# Patient Record
Sex: Female | Born: 1935 | Race: White | Hispanic: No | Smoking: Never smoker
Health system: Southern US, Community
[De-identification: ages and names within clinical notes are randomized; demographics above are authoritative.]

## PROBLEM LIST (undated history)

## (undated) DIAGNOSIS — R509 Fever, unspecified: Secondary | ICD-10-CM

## (undated) DIAGNOSIS — I639 Cerebral infarction, unspecified: Secondary | ICD-10-CM

## (undated) DIAGNOSIS — R5383 Other fatigue: Secondary | ICD-10-CM

## (undated) DIAGNOSIS — I10 Essential (primary) hypertension: Secondary | ICD-10-CM

## (undated) DIAGNOSIS — R61 Generalized hyperhidrosis: Secondary | ICD-10-CM

## (undated) DIAGNOSIS — R490 Dysphonia: Secondary | ICD-10-CM

## (undated) DIAGNOSIS — Z780 Asymptomatic menopausal state: Secondary | ICD-10-CM

## (undated) DIAGNOSIS — M316 Other giant cell arteritis: Secondary | ICD-10-CM

## (undated) HISTORY — DX: Fever, unspecified: R50.9

## (undated) HISTORY — DX: Dysphonia: R49.0

## (undated) HISTORY — PX: TEMPORAL ARTERY BIOPSY / LIGATION: SUR132

## (undated) HISTORY — DX: Cerebral infarction, unspecified: I63.9

## (undated) HISTORY — DX: Generalized hyperhidrosis: R61

## (undated) HISTORY — DX: Other fatigue: R53.83

## (undated) HISTORY — PX: OTHER SURGICAL HISTORY: SHX169

## (undated) HISTORY — DX: Asymptomatic menopausal state: Z78.0

---

## 2001-01-03 HISTORY — PX: JOINT REPLACEMENT: SHX530

## 2006-05-12 ENCOUNTER — Encounter: Admission: RE | Admit: 2006-05-12 | Discharge: 2006-05-12 | Payer: Self-pay | Admitting: Family Medicine

## 2006-05-15 ENCOUNTER — Encounter: Admission: RE | Admit: 2006-05-15 | Discharge: 2006-05-15 | Payer: Self-pay | Admitting: Family Medicine

## 2010-07-21 ENCOUNTER — Ambulatory Visit (HOSPITAL_BASED_OUTPATIENT_CLINIC_OR_DEPARTMENT_OTHER)
Admission: RE | Admit: 2010-07-21 | Discharge: 2010-07-21 | Disposition: A | Discharge status: Home / Self Care | Payer: Medicare Other | Source: Ambulatory Visit | Attending: General Surgery | Admitting: General Surgery

## 2010-07-21 ENCOUNTER — Other Ambulatory Visit (INDEPENDENT_AMBULATORY_CARE_PROVIDER_SITE_OTHER): Payer: Self-pay | Admitting: General Surgery

## 2010-07-21 DIAGNOSIS — M316 Other giant cell arteritis: Secondary | ICD-10-CM

## 2010-07-21 DIAGNOSIS — R700 Elevated erythrocyte sedimentation rate: Secondary | ICD-10-CM | POA: Insufficient documentation

## 2010-07-22 NOTE — Op Note (Signed)
NAMECAMBREIGH, DEARING               ACCOUNT NO.:  [id number]  MEDICAL RECORD NO.:  [id number]  LOCATION:                                 FACILITY:  PHYSICIAN:  Anselm Pancoast. Alejandra Barna, M.D.DATE OF BIRTH:  07/21/2008  DATE OF PROCEDURE: DATE OF DISCHARGE:                              OPERATIVE REPORT   PREOPERATIVE DIAGNOSIS:  Rule out temporal artery biopsy.  PROCEDURE:  Right temporal artery biopsy.  ANESTHESIA:  Local anesthesia in the minor surgery room.  HISTORY:  Madison Bennett is a 76 year old female who I first saw approximately a month ago when she had been referred by her primary care physician for right temporal artery biopsy.  The patient had kind of not the usual history of right-sided headache, etc., but had lost some weight, had actually fallen several months previously.  She says she just tripped and then was complaining of some kind of temporomandibular joint type tissues and her workup had a slightly elevated sed rate and the question of temporal artery disease was raised.  They had started on prednisone and she was asked to see me.  Her daughter who is a physical therapist accompanying her was questioning the necessity of a temporal artery biopsy with no headache and etc., and I had her see Dr. Lesia Sago who is managing her husband's problem of a neuropathy and aging symptoms, and Dr. Anne Hahn recommended that one go ahead and do the biopsy, but be off the prednisone for approximately 2 weeks prior to the biopsy being performed.  At present, she is having no symptoms, no headache.  Her joint soreness etc., has resolved.  She has seen Dr. Valentina Lucks and Dr. Valentina Lucks appears to think that she needs prednisone regardless, but she is here for the planned procedure.  The patient was positioned on the OR table, permit had been signed.  I prepped the area widely with Betadine solution and then had used a Doppler to kind of mark the skin where the artery is most easily  felt, there was a very good pulse.  Then she was prepped and draped and with a few mL of 1% plain Xylocaine, I anesthetized the area over the vessel, dissected down the artery, she had a good bounding pulse, and I dissected it out so that there is a good inch and a half segment of the artery including one of the bifurcations.  All of the little ends were tied with 4-0 Vicryl.  I doubly tied the proximal area and then sent this segment, which will be kind of a rush results, so we will have the results tomorrow.  Whether she gets back on steroids or not will be left up to her regular physicians, whether it is Dr. Anne Hahn or Dr. Valentina Lucks.  She is in agreement with this and I will see her in the office for removal of the sutures next week and we will call her with the results of the path report tomorrow afternoon after it was completed.     Anselm Pancoast. Zachery Dakins, M.D.     WJW/MEDQ  D:  07/21/2010  T:  07/21/2010  Job:  [id number]  cc:   Gretta Arab. Valentina Lucks, M.D. C.  Lesia Sago, M.D.  Electronically Signed by Consuello Bossier M.D. on 07/22/2010 08:31:30 AM

## 2010-07-30 ENCOUNTER — Ambulatory Visit (INDEPENDENT_AMBULATORY_CARE_PROVIDER_SITE_OTHER): Payer: Medicare Other | Admitting: General Surgery

## 2010-07-30 ENCOUNTER — Encounter (INDEPENDENT_AMBULATORY_CARE_PROVIDER_SITE_OTHER): Payer: Self-pay | Admitting: General Surgery

## 2010-07-30 VITALS — BP 150/90 | HR 104 | Temp 97.7°F | Wt 131.4 lb

## 2010-07-30 DIAGNOSIS — M316 Other giant cell arteritis: Secondary | ICD-10-CM

## 2010-07-30 NOTE — Progress Notes (Signed)
Subjective:     Patient ID: Madison Bennett, female   DOB: 15-May-1934, 76 y.o.   MRN: [id number]  HPI Vague symptoms no headache Patient had previously been started on low-dose steroids by her medical physician he saw Dr. 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 basis and has not had much side effects from the steroids

## 2011-08-24 ENCOUNTER — Other Ambulatory Visit: Payer: Self-pay | Admitting: Family Medicine

## 2011-08-24 ENCOUNTER — Ambulatory Visit
Admission: RE | Admit: 2011-08-24 | Discharge: 2011-08-24 | Disposition: A | Discharge status: Home / Self Care | Payer: Medicare Other | Source: Ambulatory Visit | Attending: Family Medicine | Admitting: Family Medicine

## 2011-08-24 DIAGNOSIS — R52 Pain, unspecified: Secondary | ICD-10-CM

## 2012-10-18 ENCOUNTER — Other Ambulatory Visit: Payer: Self-pay | Admitting: Dermatology

## 2013-04-14 ENCOUNTER — Inpatient Hospital Stay (HOSPITAL_COMMUNITY): Payer: Medicare Other

## 2013-04-14 ENCOUNTER — Encounter (HOSPITAL_COMMUNITY): Payer: Self-pay | Admitting: Emergency Medicine

## 2013-04-14 ENCOUNTER — Emergency Department (HOSPITAL_COMMUNITY): Payer: Medicare Other

## 2013-04-14 ENCOUNTER — Inpatient Hospital Stay (HOSPITAL_COMMUNITY)
Admission: EM | Admit: 2013-04-14 | Discharge: 2013-04-15 | DRG: 069 | Disposition: A | Discharge status: Home / Self Care | Payer: Medicare Other | Attending: Internal Medicine | Admitting: Internal Medicine

## 2013-04-14 DIAGNOSIS — Z823 Family history of stroke: Secondary | ICD-10-CM

## 2013-04-14 DIAGNOSIS — I129 Hypertensive chronic kidney disease with stage 1 through stage 4 chronic kidney disease, or unspecified chronic kidney disease: Secondary | ICD-10-CM | POA: Diagnosis present

## 2013-04-14 DIAGNOSIS — IMO0002 Reserved for concepts with insufficient information to code with codable children: Secondary | ICD-10-CM

## 2013-04-14 DIAGNOSIS — G458 Other transient cerebral ischemic attacks and related syndromes: Principal | ICD-10-CM | POA: Diagnosis present

## 2013-04-14 DIAGNOSIS — Z7982 Long term (current) use of aspirin: Secondary | ICD-10-CM

## 2013-04-14 DIAGNOSIS — M316 Other giant cell arteritis: Secondary | ICD-10-CM

## 2013-04-14 DIAGNOSIS — N183 Chronic kidney disease, stage 3 unspecified: Secondary | ICD-10-CM | POA: Diagnosis present

## 2013-04-14 DIAGNOSIS — R51 Headache: Secondary | ICD-10-CM

## 2013-04-14 DIAGNOSIS — I639 Cerebral infarction, unspecified: Secondary | ICD-10-CM

## 2013-04-14 DIAGNOSIS — E785 Hyperlipidemia, unspecified: Secondary | ICD-10-CM | POA: Diagnosis present

## 2013-04-14 DIAGNOSIS — Z96649 Presence of unspecified artificial hip joint: Secondary | ICD-10-CM

## 2013-04-14 DIAGNOSIS — I10 Essential (primary) hypertension: Secondary | ICD-10-CM

## 2013-04-14 DIAGNOSIS — G459 Transient cerebral ischemic attack, unspecified: Secondary | ICD-10-CM

## 2013-04-14 DIAGNOSIS — R519 Headache, unspecified: Secondary | ICD-10-CM | POA: Diagnosis present

## 2013-04-14 HISTORY — DX: Essential (primary) hypertension: I10

## 2013-04-14 HISTORY — DX: Other giant cell arteritis: M31.6

## 2013-04-14 LAB — COMPREHENSIVE METABOLIC PANEL
ALT: 22 U/L (ref 0–35)
Albumin: 3.9 g/dL (ref 3.5–5.2)
Calcium: 10.4 mg/dL (ref 8.4–10.5)
GFR calc Af Amer: 53 mL/min — ABNORMAL LOW (ref >90)
Glucose, Bld: 89 mg/dL (ref 70–99)
Potassium: 4 mEq/L (ref 3.7–5.3)
Sodium: 140 mEq/L (ref 137–147)
Total Protein: 7.6 g/dL (ref 6.0–8.3)

## 2013-04-14 LAB — CBC
HCT: 39 % (ref 36.0–46.0)
HCT: 38.9 % (ref 36.0–46.0)
Hemoglobin: 12.8 g/dL (ref 12.0–15.0)
Hemoglobin: 13.2 g/dL (ref 12.0–15.0)
MCH: 29 pg (ref 26.0–34.0)
MCH: 29.7 pg (ref 26.0–34.0)
MCHC: 32.8 g/dL (ref 30.0–36.0)
MCHC: 33.9 g/dL (ref 30.0–36.0)
MCV: 88.2 fL (ref 78.0–100.0)
MCV: 87.4 fL (ref 78.0–100.0)
PLATELETS: 281 10*3/uL (ref 150–400)
Platelets: 284 10*3/uL (ref 150–400)
RBC: 4.42 MIL/uL (ref 3.87–5.11)
RBC: 4.45 MIL/uL (ref 3.87–5.11)
RDW: 13.5 % (ref 11.5–15.5)
RDW: 13.5 % (ref 11.5–15.5)
WBC: 9.2 K/uL (ref 4.0–10.5)
WBC: 10.9 10*3/uL — ABNORMAL HIGH (ref 4.0–10.5)

## 2013-04-14 LAB — URINALYSIS, ROUTINE W REFLEX MICROSCOPIC
Bilirubin Urine: NEGATIVE
Glucose, UA: NEGATIVE mg/dL
Hgb urine dipstick: NEGATIVE
Ketones, ur: NEGATIVE mg/dL
Leukocytes, UA: NEGATIVE
Nitrite: NEGATIVE
Protein, ur: NEGATIVE mg/dL
Specific Gravity, Urine: 1.009 (ref 1.005–1.030)
Urobilinogen, UA: 0.2 mg/dL (ref 0.0–1.0)
pH: 6 (ref 5.0–8.0)

## 2013-04-14 LAB — COMPREHENSIVE METABOLIC PANEL WITH GFR
AST: 28 U/L (ref 0–37)
Alkaline Phosphatase: 81 U/L (ref 39–117)
BUN: 19 mg/dL (ref 6–23)
CO2: 25 meq/L (ref 19–32)
Chloride: 102 meq/L (ref 96–112)
Creatinine, Ser: 1.14 mg/dL — ABNORMAL HIGH (ref 0.50–1.10)
GFR calc non Af Amer: 46 mL/min — ABNORMAL LOW (ref >90)
Total Bilirubin: 0.3 mg/dL (ref 0.3–1.2)

## 2013-04-14 LAB — DIFFERENTIAL
Basophils Absolute: 0 10*3/uL (ref 0.0–0.1)
Basophils Relative: 0 % (ref 0–1)
Eosinophils Absolute: 0.2 10*3/uL (ref 0.0–0.7)
Eosinophils Relative: 2 % (ref 0–5)
Lymphocytes Relative: 22 % (ref 12–46)
Lymphs Abs: 2.1 10*3/uL (ref 0.7–4.0)
Monocytes Absolute: 0.6 K/uL (ref 0.1–1.0)
Monocytes Relative: 7 % (ref 3–12)
Neutro Abs: 6.3 K/uL (ref 1.7–7.7)
Neutrophils Relative %: 68 % (ref 43–77)

## 2013-04-14 LAB — CREATININE, SERUM
Creatinine, Ser: 1.05 mg/dL (ref 0.50–1.10)
GFR calc Af Amer: 58 mL/min — ABNORMAL LOW (ref >90)
GFR calc non Af Amer: 50 mL/min — ABNORMAL LOW (ref >90)

## 2013-04-14 LAB — I-STAT CHEM 8, ED
BUN: 19 mg/dL (ref 6–23)
Calcium, Ion: 1.27 mmol/L (ref 1.13–1.30)
Chloride: 105 meq/L (ref 96–112)
Creatinine, Ser: 1.3 mg/dL — ABNORMAL HIGH (ref 0.50–1.10)
Glucose, Bld: 87 mg/dL (ref 70–99)
HCT: 40 % (ref 36.0–46.0)
Hemoglobin: 13.6 g/dL (ref 12.0–15.0)
Potassium: 3.8 meq/L (ref 3.7–5.3)
Sodium: 142 mEq/L (ref 137–147)
TCO2: 24 mmol/L (ref 0–100)

## 2013-04-14 LAB — RAPID URINE DRUG SCREEN, HOSP PERFORMED
Amphetamines: NOT DETECTED
Barbiturates: NOT DETECTED
Benzodiazepines: NOT DETECTED
Cocaine: NOT DETECTED
Opiates: NOT DETECTED
Tetrahydrocannabinol: NOT DETECTED

## 2013-04-14 LAB — CBG MONITORING, ED: Glucose-Capillary: 85 mg/dL (ref 70–99)

## 2013-04-14 LAB — ETHANOL: Alcohol, Ethyl (B): <11 mg/dL (ref 0–11)

## 2013-04-14 LAB — APTT: aPTT: 27 s (ref 24–37)

## 2013-04-14 LAB — I-STAT TROPONIN, ED: Troponin i, poc: 0 ng/mL (ref 0.00–0.08)

## 2013-04-14 LAB — PROTIME-INR
INR: 0.95 (ref 0.00–1.49)
Prothrombin Time: 12.5 seconds (ref 11.6–15.2)

## 2013-04-14 MED ORDER — GLUCOSAMINE HCL 1500 MG PO TABS
1500.0000 mg | ORAL_TABLET | Freq: Every day | ORAL | Status: DC
  Filled 2013-04-14: qty 1

## 2013-04-14 MED ORDER — SODIUM CHLORIDE 0.9 % IV SOLN: 250.0000 mL | INTRAVENOUS | Status: DC | PRN

## 2013-04-14 MED ORDER — SENNOSIDES-DOCUSATE SODIUM 8.6-50 MG PO TABS: 1.0000 | ORAL_TABLET | Freq: Every evening | ORAL | Status: DC | PRN

## 2013-04-14 MED ORDER — ADULT MULTIVITAMIN W/MINERALS CH
1.0000 | ORAL_TABLET | Freq: Every day | ORAL | Status: DC
  Administered 2013-04-15: 1 via ORAL
  Filled 2013-04-14 (×2): qty 1

## 2013-04-14 MED ORDER — CALCIUM CARBONATE-VITAMIN D 500-200 MG-UNIT PO TABS
1.0000 | ORAL_TABLET | Freq: Every day | ORAL | Status: DC
  Administered 2013-04-15: 1 via ORAL
  Filled 2013-04-14 (×2): qty 1

## 2013-04-14 MED ORDER — ASPIRIN 300 MG RE SUPP
300.0000 mg | Freq: Every day | RECTAL | Status: DC
  Filled 2013-04-14: qty 1

## 2013-04-14 MED ORDER — OMEGA-3 FATTY ACIDS 1000 MG PO CAPS
2.0000 g | ORAL_CAPSULE | Freq: Every day | ORAL | Status: DC
  Administered 2013-04-15: 2 g via ORAL
  Filled 2013-04-14 (×2): qty 2

## 2013-04-14 MED ORDER — PREDNISONE 1 MG PO TABS
1.0000 mg | ORAL_TABLET | Freq: Every day | ORAL | Status: DC
  Administered 2013-04-15: 1 mg via ORAL
  Filled 2013-04-14 (×2): qty 1

## 2013-04-14 MED ORDER — HYDRALAZINE HCL 20 MG/ML IJ SOLN: 5.0000 mg | INTRAMUSCULAR | Status: DC | PRN

## 2013-04-14 MED ORDER — ACETAMINOPHEN 325 MG PO TABS: 650.0000 mg | ORAL_TABLET | ORAL | Status: DC | PRN

## 2013-04-14 MED ORDER — SODIUM CHLORIDE 0.9 % IJ SOLN: 3.0000 mL | INTRAMUSCULAR | Status: DC | PRN

## 2013-04-14 MED ORDER — OXYCODONE HCL 5 MG PO TABS: 5.0000 mg | ORAL_TABLET | Freq: Four times a day (QID) | ORAL | Status: DC | PRN

## 2013-04-14 MED ORDER — ENOXAPARIN SODIUM 30 MG/0.3ML ~~LOC~~ SOLN
30.0000 mg | Freq: Two times a day (BID) | SUBCUTANEOUS | Status: DC
  Administered 2013-04-14 – 2013-04-15 (×2): 30 mg via SUBCUTANEOUS
  Filled 2013-04-14 (×4): qty 0.3

## 2013-04-14 MED ORDER — ASPIRIN 325 MG PO TABS
325.0000 mg | ORAL_TABLET | Freq: Every day | ORAL | Status: DC
  Administered 2013-04-15: 325 mg via ORAL
  Filled 2013-04-14: qty 1

## 2013-04-14 MED ORDER — SODIUM CHLORIDE 0.9 % IJ SOLN
3.0000 mL | Freq: Two times a day (BID) | INTRAMUSCULAR | Status: DC
  Administered 2013-04-14: 3 mL via INTRAVENOUS

## 2013-04-14 NOTE — ED Provider Notes (Addendum)
CSN: [id number]     Arrival date & time 04/14/13  1023 History   First MD Initiated Contact with Patient 04/14/13 1040     Chief Complaint  Patient presents with  . Code Stroke     (Consider location/radiation/quality/duration/timing/severity/associated sxs/prior Treatment) HPI Comments: Pt had gotten up this AM and gotten ready for church, had no balance or speech difficulty. She told spouse at about 0930 as they were getting ready to leave for church that she was having trouble speaking and getting out what she wanted to say to him, but she wanted to go ahead and go on to church which was 5 minutes away.  Her husband does have some dementia and memory difficulty.  As pt arrived at church, as soon as she came into Sunday School, friend reports she was crying because she was having difficulty speaking.  She had stopped to speak to others also and and was having difficulty getting words out and reportedly other witnesses there believed she had trouble speaking.  Friend reports pt was falling to the side as well.    Patient is a 79 y.o. female presenting with Acute Neurological Problem. The history is provided by the patient, the spouse and a friend.  Cerebrovascular Accident This is a new problem. The current episode started 1 to 2 hours ago. The problem has been resolved. Pertinent negatives include no chest pain, no abdominal pain, no headaches and no shortness of breath. Nothing aggravates the symptoms. Nothing relieves the symptoms.    Past Medical History  Diagnosis Date  . Fatigue   . Night sweats   . Fever   . Stroke   . Hoarseness     sore throat  . Menopause    Past Surgical History  Procedure Laterality Date  . Joint replacement  2003    right hip  . Temporal artery biopsy / ligation      07/2010   History reviewed. No pertinent family history. History  Substance Use Topics  . Smoking status: Never Smoker   . Smokeless tobacco: Not on file  . Alcohol Use: No   OB  History   Grav Para Term Preterm Abortions TAB SAB Ect Mult Living                 Review of Systems  Eyes: Negative for visual disturbance.  Respiratory: Negative for shortness of breath.   Cardiovascular: Negative for chest pain.  Gastrointestinal: Negative for abdominal pain.  Musculoskeletal: Positive for gait problem.  Neurological: Positive for speech difficulty. Negative for headaches.  All other systems reviewed and are negative.     Allergies  Tetanus toxoids  Home Medications   Current Outpatient Rx  Name  Route  Sig  Dispense  Refill  . Calcium Carbonate-Vitamin D (CALCIUM 600 + D PO)   Oral   Take 1 tablet by mouth 2 (two) times daily.          . fish oil-omega-3 fatty acids 1000 MG capsule   Oral   Take 2 g by mouth daily.          . Glucosamine HCl 1500 MG TABS   Oral   Take 1 tablet by mouth at bedtime.          . Multiple Vitamins-Minerals (CENTRUM SILVER PO)   Oral   Take 1 tablet by mouth daily.          Marland Kitchen aspirin 81 MG tablet   Oral   Take 81 mg by  mouth daily.           . ferrous sulfate 325 (65 FE) MG tablet   Oral   Take 325 mg by mouth daily with breakfast.           . omeprazole (PRILOSEC) 20 MG capsule   Oral   Take 20 mg by mouth daily.            BP 192/82  Pulse 73  Temp(Src) 98.8 F (37.1 C) (Oral)  Resp 17  SpO2 96% Physical Exam  Nursing note and vitals reviewed. Constitutional: She appears well-developed and well-nourished.  Neurological: She is alert. She displays no tremor. No cranial nerve deficit or sensory deficit. She exhibits normal muscle tone. GCS eye subscore is 4. GCS verbal subscore is 5. GCS motor subscore is 6.  Slight heel shin abn of left leg, minimal arm drift on right UE and slight abn of finger to nose on right.  No leg drift, normal speech, no CN deficits at present.  Normal memory.    Skin: Skin is warm.  Psychiatric: She has a normal mood and affect. Her speech is normal and behavior  is normal. Judgment normal. Cognition and memory are normal.    ED Course  Procedures (including critical care time) CRITICAL CARE Performed by: Gavin PoundMichael Y. Macen Joslin Total critical care time: 30 min Critical care time was exclusive of separately billable procedures and treating other patients. Critical care was necessary to treat or prevent imminent or life-threatening deterioration. Critical care was time spent personally by me on the following activities: development of treatment plan with patient and/or surrogate as well as nursing, discussions with consultants, evaluation of patient's response to treatment, examination of patient, obtaining history from patient or surrogate, ordering and performing treatments and interventions, ordering and review of laboratory studies, ordering and review of radiographic studies, pulse oximetry and re-evaluation of patient's condition.    Labs Review Labs Reviewed  I-STAT CHEM 8, ED - Abnormal; Notable for the following:    Creatinine, Ser 1.30 (*)    All other components within normal limits  PROTIME-INR  APTT  CBC  DIFFERENTIAL  ETHANOL  COMPREHENSIVE METABOLIC PANEL  URINE RAPID DRUG SCREEN (HOSP PERFORMED)  URINALYSIS, ROUTINE W REFLEX MICROSCOPIC  CBG MONITORING, ED  I-STAT TROPOININ, ED   Imaging Review Ct Head Wo Contrast  04/14/2013   EXAM: CT HEAD WITHOUT CONTRAST  TECHNIQUE: Contiguous axial images were obtained from the base of the skull through the vertex without intravenous contrast.  COMPARISON:  None.  FINDINGS: There is a 3.4 x 1.8 cm area of hypodensity and loss of the normal sulci in the right parietal region. See image 18. This appearances compatible with acute to subacute infarction. Negative for intra or extra-axial hemorrhage, midline shift, hydrocephalus, or evidence of mass lesion. Visualized paranasal sinuses and mastoid air cells are clear. There is atherosclerotic calcification of the intracranial internal carotid arteries.  The skull is intact. Soft tissues of the scalp are symmetric. Postsurgical changes of both orbits.  IMPRESSION: Hypodensity in the right parietal lobe is compatible with acute to subacute stroke. Critical Value/emergent results were called by telephone at the time of interpretation on 04/14/2013 at 11:25 AM to Dr. Quita SkyeMICHAEL Eyana Stolze , who verbally acknowledged these results.  Negative for hemorrhage, midline shift, or hydrocephalus.   Electronically Signed   By: Britta MccreedySusan  Turner M.D.   On: 04/14/2013 11:26     EKG Interpretation   Date/Time:  Sunday April 14 2013 10:51:56 EDT Ventricular  Rate:  87 PR Interval:  155 QRS Duration: 102 QT Interval:  385 QTC Calculation: 463 R Axis:   -26 Text Interpretation:  Sinus rhythm Probable left atrial enlargement  Borderline left axis deviation Anteroseptal infarct, old Minimal ST  depression, lateral leads Abnormal ekg No previous tracing Confirmed by  [facility]  MD, Russella Dar ([id number]) on 04/14/2013 11:09:16 AM       11:33 AM Discussed with Dr. Thad Ranger who agrees to transfer to [facility].  [facility] can do initial admission and arrange transfer to [facility].    11:52 AM Spoke to Dr. Darnelle Catalan who will see pt in the ED and help facilitate transfer to [facility].  Pt and family, friend are informed of head CT results and pending transfer to [facility].    MDM   Final diagnoses:  Stroke  Hypertension    Pt with history concerning for stroke.  However symptoms are quickly resolving or resolved.  Also, since only other witness to time onset is spouse who has memory problems, I am not completely convinced that definitiive time of onset was at 0930.  May have been prior to that.  At this point, NIH score is low, symptoms are mild and improved from initial onset and would favor that TPA is not indicated at this time.  Will discuss with Neuro Hospitalist and arrange for transfer to the ED at [facility].  Pt did take a full ASA this AM.  Level of Consciousness (1a. ): Alert, keenly responsive (04/12  1114) LOC Questions (1b. ): Answers both questions correctly (04/12 1114) LOC Commands (1c. ): Performs both tasks correctly (04/12 1114) Facial Palsy (4. ): Normal symmetrical movements (04/12 1114) Motor Arm, Left (5a. ): No drift (04/12 1114) Motor Arm, Right (5b. ): Minimal (04/12 1114) Motor Leg, Left (6a. ): No drift (04/12 1114) Motor Leg, Right (6b. ): No drift (04/12 1114) Minimal abn heel shin on left    Gavin Pound. Oletta Lamas, MD 04/14/13 1136  Gavin Pound. Oletta Lamas, MD 04/14/13 1137  Gavin Pound. Kyrell Ruacho, MD 04/14/13 1153

## 2013-04-14 NOTE — Consult Note (Signed)
Referring Physician: Dr Catha Gosselin    Chief Complaint: TIA / Stroke  HPI: Madison Bennett is a 79 y.o. female who was getting into the car with her husband this morning, 04/14/2013, to go to church when she realized she was having difficulty with her speech. She was unable to think of the words that she wanted to say. The episode occurred at approximately 9:25 AM but quickly resolved. Shortly after they arrived at church the patient again had difficulties with her speech, felt mildly confused, and had a right-sided headache. She denied focal weakness. She became upset and tearful and was brought to the emergency department at [facility]. Again her symptoms quickly resolved. A CT scan of the head showed a hypodensity in the right parietal lobe compatible with acute to subacute stroke. The patient has been transferred to [facility] for a full stroke workup.   The patient has been on prednisone for giant cell arteritis. Apparently the prednisone was being tapered. She also had been off her antihypertensive medications for several weeks. She stated that she wanted to stop all of her medications because she is tired of taking them. She had not stopped taking her daily 81 mgs of aspirin although today she took an additional 325 mg of aspirin.  The patient believes she probably had a stroke in the 82s while in [place]. Apparently she fell in a restaurant. She does not remember if she had any focal weakness at that time.   She currently lives with her husband of 55 years who has early dementia and is hard of hearing. She has 2 daughters one of whom is a physical therapist.  Date last known well: Date: 04/14/2013 Time last known well: Time: 09:20 tPA Given: No: The patient's deficits had quickly resolved.  Past Medical History  Diagnosis Date  . Fatigue   . Night sweats   . Fever   . Stroke   . Hoarseness     sore throat  . Menopause   . Giant cell aortic arteritis 04/14/2013  . HTN (hypertension) 04/14/2013     Past Surgical History  Procedure Laterality Date  . Joint replacement  2003    right hip  . Temporal artery biopsy / ligation      07/2010  . Bilateral cateract surgery      Family History  Problem Relation Age of Onset  . Stroke Mother   . Alcoholism Mother    Social History:  reports that she has never smoked. She does not have any smokeless tobacco history on file. She reports that she does not drink alcohol or use illicit drugs.  Allergies:  Allergies  Allergen Reactions  . Tetanus Toxoids Swelling    Medications:  Scheduled: . aspirin  300 mg Rectal Daily   Or  . aspirin  325 mg Oral Daily  . [START ON 04/15/2013] calcium-vitamin D  1 tablet Oral Q breakfast  . enoxaparin (LOVENOX) injection  30 mg Subcutaneous Q12H  . fish oil-omega-3 fatty acids  2 g Oral Daily  . Glucosamine HCl  1,500 mg Oral QHS  . multivitamin with minerals  1 tablet Oral Daily  . [START ON 04/15/2013] predniSONE  1 mg Oral Q breakfast  . sodium chloride  3 mL Intravenous Q12H    ROS: History obtained from the patient  General ROS: negative for - chills, fatigue, fever, night sweats, or weight loss. Positive for recent weight gain which she attributes to prednisone therapy. Positive for generalized weakness. Psychological ROS: negative for -  behavioral disorder, hallucinations, memory difficulties, mood swings or suicidal ideation Ophthalmic ROS: negative for - blurry vision, double vision, eye pain or loss of vision ENT ROS: negative for - epistaxis, nasal discharge, oral lesions, sore throat, tinnitus or vertigo Allergy and Immunology ROS: negative for - hives or itchy/watery eyes Hematological and Lymphatic ROS: negative for - bleeding problems, bruising or swollen lymph nodes Endocrine ROS: negative for - galactorrhea, hair pattern changes, polydipsia/polyuria or temperature intolerance Respiratory ROS: negative for - cough, hemoptysis, shortness of breath or wheezing Cardiovascular  ROS: negative for - chest pain, dyspnea on exertion, edema or irregular heartbeat Gastrointestinal ROS: negative for - abdominal pain, diarrhea, hematemesis, nausea/vomiting or stool incontinence Genito-Urinary ROS: negative for - dysuria, hematuria, incontinence or urinary frequency. Positive for urgency. Musculoskeletal ROS: negative for - joint swelling or muscular weakness. Positive for arthritis. Neurological ROS: as noted in HPI Dermatological ROS: negative for rash and skin lesion changes  Physical Examination: Blood pressure 161/72, pulse 87, temperature 97.5 F (36.4 C), temperature source Oral, resp. rate 16, height 5\' 6"  (1.676 m), weight 151 lb 14.4 oz (68.901 kg), SpO2 100.00%.  Neurologic Examination: Mental Status: Alert, oriented, thought content appropriate.  Speech fluent without evidence of aphasia.  Able to follow 3 step commands without difficulty. Cranial Nerves: II: Discs not visualized; Visual fields grossly normal, pupils equal, round, reactive to light and accommodation III,IV, VI: ptosis not present, extra-ocular motions intact bilaterally V,VII: smile symmetric, facial light touch sensation normal bilaterally VIII: hearing normal bilaterally IX,X: gag reflex present XI: bilateral shoulder shrug XII: midline tongue extension Motor: Right : Upper extremity   5/5    Left:     Upper extremity   5/5  Lower extremity   5/5     Lower extremity   5/5 Tone and bulk:normal tone throughout; no atrophy noted Sensory: Pinprick and light touch intact throughout, bilaterally Deep Tendon Reflexes: 2+ and symmetric with absent AJ's bilaterally Plantars: Right: downgoing   Left: downgoing Cerebellar: normal finger-to-nose, normal rapid alternating movements and normal heel-to-shin test Gait: Deferred.   Laboratory Studies:  Basic Metabolic Panel:  Recent Labs Lab 04/14/13 1104 04/14/13 1112  NA 140 142  K 4.0 3.8  CL 102 105  CO2 25  --   GLUCOSE 89 87  BUN 19  19  CREATININE 1.14* 1.30*  CALCIUM 10.4  --     Liver Function Tests:  Recent Labs Lab 04/14/13 1104  AST 28  ALT 22  ALKPHOS 81  BILITOT 0.3  PROT 7.6  ALBUMIN 3.9   No results found for this basename: LIPASE, AMYLASE,  in the last 168 hours No results found for this basename: AMMONIA,  in the last 168 hours  CBC:  Recent Labs Lab 04/14/13 1104 04/14/13 1112  WBC 9.2  --   NEUTROABS 6.3  --   HGB 12.8 13.6  HCT 39.0 40.0  MCV 88.2  --   PLT 284  --     Cardiac Enzymes: No results found for this basename: CKTOTAL, CKMB, CKMBINDEX, TROPONINI,  in the last 168 hours  BNP: No components found with this basename: POCBNP,   CBG:  Recent Labs Lab 04/14/13 1046  GLUCAP 85    Microbiology: No results found for this or any previous visit.  Coagulation Studies:  Recent Labs  04/14/13 1104  LABPROT 12.5  INR 0.95    Urinalysis:  Recent Labs Lab 04/14/13 1224  COLORURINE YELLOW  LABSPEC 1.009  PHURINE 6.0  GLUCOSEU NEGATIVE  HGBUR NEGATIVE  BILIRUBINUR NEGATIVE  KETONESUR NEGATIVE  PROTEINUR NEGATIVE  UROBILINOGEN 0.2  NITRITE NEGATIVE  LEUKOCYTESUR NEGATIVE    Lipid Panel: No results found for this basename: chol, trig, hdl, cholhdl, vldl, ldlcalc    HgbA1C:  No results found for this basename: HGBA1C    Urine Drug Screen:     Component Value Date/Time   LABOPIA NONE DETECTED 04/14/2013 1224   COCAINSCRNUR NONE DETECTED 04/14/2013 1224   LABBENZ NONE DETECTED 04/14/2013 1224   AMPHETMU NONE DETECTED 04/14/2013 1224   THCU NONE DETECTED 04/14/2013 1224   LABBARB NONE DETECTED 04/14/2013 1224    Alcohol Level:  Recent Labs Lab 04/14/13 1104  ETH <11    Other results: EKG:- Sinus rhythm rate 87 beats per minute. Please see formal reading for full details.  Imaging:  Ct Head Wo Contrast 04/14/2013    Hypodensity in the right parietal lobe is compatible with acute to subacute stroke.   Patient seen and examined.  Clinical course  and management discussed.  Necessary edits performed.  I agree with the above.  Assessment and plan of care developed and discussed below.    Stroke Risk Factors - family history, hypertension and Previous stroke  Hassel Neth [facility] Pager [phone] 04/14/2013, 3:25 PM  Assessment: 79 y.o. female two transient episodes of aphasia and possibly mild confusion that occurred this morning on the way to church and shortly after arrival. The patient's deficits have completely resolved.  Head CT scan reviewed and shows a hypodensity in the right parietal lobe.  There is a question as to whether this represents an acute to subacute stroke. The patient had stopped taking her antihypertensive medication several weeks ago and her blood pressure was 192/82 in the emergency department today. She is on an aspirin a day at home.  Further work up recommended.  Plan: 1. HgbA1c, fasting lipid panel 2. MRI, MRA  of the brain without contrast 3. PT consult, OT consult, Speech consult 4. Echocardiogram 5. Carotid dopplers 6. Prophylactic therapy-Antiplatelet med: Aspirin - dose 325 mg daily. 7. Risk factor modification 8. Telemetry monitoring 9. Frequent neuro checks  Thana Farr, MD [facility] [phone]  04/14/2013  6:06 PM

## 2013-04-14 NOTE — ED Notes (Signed)
Attempted to call report to unit.  RN at lunch and charge nurse unable to take report.  RN to return call when able.

## 2013-04-14 NOTE — ED Notes (Signed)
Report given to [facility] in route

## 2013-04-14 NOTE — ED Notes (Signed)
She states that as she was getting ready for church; she had "trouble getting my words out"; which has improved somewhat since then.  She is articulate at present with clear speech.  She states that while she was at church, they noted her to have speech difficulty and some balance issues.  Her skin is normal, warm and dry and she is breathing normally.

## 2013-04-14 NOTE — ED Notes (Addendum)
CT notified for head CT in room getting patient. MD calling pt normal neuro at this time. Continue to monitor for changes.

## 2013-04-14 NOTE — H&P (Signed)
History and Physical    Madison Bennett ZOX:[id number] DOB: 17-Aug-1934 DOA: 04/14/2013  Referring physician: Dr. Quita Skye PCP: Astrid Divine, MD   Chief Complaint: Speech difficulty    History of Present Illness:    Madison Bennett is an 79 y.o. female with a PMH of HTN (ran out of BP medication 2 months ago and never got refilled), giant cell arteritis (on steroids since 2012, currently on 1mg  Prednisone daily), who told spouse at about 0930, as they were getting ready to leave for church, that she was having trouble speaking and getting out what she wanted to say to him, but she wanted to go ahead and go on to church which was 5 minutes away. Her husband does have some dementia and memory difficulty. As pt arrived at church, friend reports she was crying because she was having difficulty speaking and was falling to the side as well. She also has complained of a right-sided headache. No visual complaints. No difficulty swallowing. Patient currently says that her speech is back to normal.   ROS:    Constitutional: No fever, no chills;  Appetite normal; No weight loss, + 10 lb weight gain, + fatigue.  HEENT: No blurry vision, no diplopia, no pharyngitis, no dysphagia, +dysarthria/aphasia CV: No chest pain, no palpitations, no PND.  Resp: No SOB, no cough, no pleuritic pain. GI: No nausea, no vomiting, no diarrhea, no melena, no hematochezia, no constipation.  GU: No dysuria, no hematuria, no frequency, + urgency x 2 months. MSK: no myalgias, + neck and shoulder arthralgias.  Neuro:  + headache, no focal neurological deficits, no history of seizures.  Psych: No depression, no anxiety.  Endo: No heat intolerance, no cold intolerance, no polyuria, no polydipsia  Skin: No rashes, no skin lesions.  Heme: No easy bruising.     Past Medical History:    Past Medical History  Diagnosis Date  . Fatigue   . Night sweats   . Fever   . Stroke   . Hoarseness     sore throat  .  Menopause   . Giant cell aortic arteritis 04/14/2013  . HTN (hypertension) 04/14/2013     Past Surgical History:    Past Surgical History  Procedure Laterality Date  . Joint replacement  2003    right hip  . Temporal artery biopsy / ligation      07/2010  . Bilateral cateract surgery       Social History:    History   Social History  . Marital Status: Unknown    Spouse Name: N/A    Number of Children: 2  . Years of Education: N/A   Occupational History  . Not on file.   Social History Main Topics  . Smoking status: Never Smoker   . Smokeless tobacco: Not on file  . Alcohol Use: No  . Drug Use: No  . Sexual Activity:    Other Topics Concern  . Not on file   Social History Narrative   Married.  Lives with husband.  Independent of ADLs/ambulation.     Family history:    Family History  Problem Relation Age of Onset  . Stroke Mother   . Alcoholism Mother      Allergies    Tetanus toxoids   Current Medications:    Prior to Admission medications   Medication Sig Start Date End Date Taking? Authorizing Provider  aspirin 325 MG tablet Take 325 mg by mouth daily as needed  for headache.   Yes Historical Provider, MD  aspirin 81 MG tablet Take 81 mg by mouth daily.     Yes Historical Provider, MD  benazepril-hydrochlorthiazide (LOTENSIN HCT) 10-12.5 MG per tablet Take 1 tablet by mouth daily.   Yes Historical Provider, MD  Calcium Carbonate-Vitamin D (CALCIUM 600 + D PO) Take 1 tablet by mouth 2 (two) times daily.    Yes Historical Provider, MD  fish oil-omega-3 fatty acids 1000 MG capsule Take 2 g by mouth daily.    Yes Historical Provider, MD  Glucosamine HCl 1500 MG TABS Take 1,500 tablets by mouth at bedtime.    Yes Historical Provider, MD  Multiple Vitamins-Minerals (CENTRUM SILVER PO) Take 1 tablet by mouth daily.    Yes Historical Provider, MD  predniSONE (DELTASONE) 1 MG tablet Take 1 mg by mouth daily with breakfast.   Yes Historical Provider, MD      Physical Exam:    Filed Vitals:   04/14/13 1041 04/14/13 1151 04/14/13 1228  BP: 192/82  183/87  Pulse: 73    Temp: 98.8 F (37.1 C) 98.8 F (37.1 C)   TempSrc: Oral    Resp: 17  18  SpO2: 96%  98%     Physical Exam: Blood pressure 183/87, pulse 73, temperature 98.8 F (37.1 C), temperature source Oral, resp. rate 18, SpO2 98.00%. Gen: No acute distress. Head: Normocephalic, atraumatic. Eyes: PERRL, EOMI, sclerae nonicteric. Mouth: Oropharynx Neck: Supple, no thyromegaly, no lymphadenopathy, no jugular venous distention. Chest: Lungs CV: Heart sounds Abdomen: Soft, nontender, nondistended with normal active bowel sounds. Extremities: Extremities Skin: Warm and dry. Psych: Mood and affect normal. Neurologic Examination:  Mental Status:  Alert, oriented x 3.  Cranial Nerves:  II: Discs flat bilaterally; Visual fields grossly normal, pupils equal, round, reactive to light and accommodation  III,IV, VI: ptosis not present, extra-ocular motions intact bilaterally  V,VII: smile symmetric, facial light touch sensation normal bilaterally  VIII: hearing normal bilaterally  IX,X: gag reflex present  XI: bilateral shoulder shrug normal and equal XII: midline tongue extension  Motor:  Right Upper extremity 5/5 Left Upper extremity 5/5  Right Lower extremity 5/5 Left Lower extremity 4+/5  Tone and bulk:normal tone throughout; no atrophy noted  Sensory: withdraws to light noxious stimuli throughout  Deep Tendon Reflexes: Not tested. Cerebellar:  Normal finger-to-nose, heel to shin CV: Pulses palpable throughout   Data Review:    Labs: Basic Metabolic Panel:  Recent Labs Lab 04/14/13 1104 04/14/13 1112  NA 140 142  K 4.0 3.8  CL 102 105  CO2 25  --   GLUCOSE 89 87  BUN 19 19  CREATININE 1.14* 1.30*  CALCIUM 10.4  --    Liver Function Tests:  Recent Labs Lab 04/14/13 1104  AST 28  ALT 22  ALKPHOS 81  BILITOT 0.3  PROT 7.6  ALBUMIN 3.9    CBC:  Recent Labs Lab 04/14/13 1104 04/14/13 1112  WBC 9.2  --   NEUTROABS 6.3  --   HGB 12.8 13.6  HCT 39.0 40.0  MCV 88.2  --   PLT 284  --    CBG:  Recent Labs Lab 04/14/13 1046  GLUCAP 85    Radiographic Studies: Ct Head Wo Contrast  04/14/2013   EXAM: CT HEAD WITHOUT CONTRAST  TECHNIQUE: Contiguous axial images were obtained from the base of the skull through the vertex without intravenous contrast.  COMPARISON:  None.  FINDINGS: There is a 3.4 x 1.8 cm area of hypodensity  and loss of the normal sulci in the right parietal region. See image 18. This appearances compatible with acute to subacute infarction. Negative for intra or extra-axial hemorrhage, midline shift, hydrocephalus, or evidence of mass lesion. Visualized paranasal sinuses and mastoid air cells are clear. There is atherosclerotic calcification of the intracranial internal carotid arteries. The skull is intact. Soft tissues of the scalp are symmetric. Postsurgical changes of both orbits.  IMPRESSION: Hypodensity in the right parietal lobe is compatible with acute to subacute stroke. Critical Value/emergent results were called by telephone at the time of interpretation on 04/14/2013 at 11:25 AM to Dr. Quita SkyeMICHAEL GHIM , who verbally acknowledged these results.  Negative for hemorrhage, midline shift, or hydrocephalus.   Electronically Signed   By: Britta MccreedySusan  Turner M.D.   On: 04/14/2013 11:26    EKG: Independently reviewed. Sinus rhythm; Probable left atrial enlargement; Borderline left axis deviation; Q waves in the anteroseptal leads; Minimal ST depression, lateral leads; No previous tracing for comparison.   Assessment/Plan:   Principal problem:    Stroke Patient will be admitted and a full stroke workup initiated. We'll check lipid panel and hemoglobin A1c. MRI/MRA, 2-D echocardiogram and carotid Dopplers ordered. Continue full-strength aspirin daily. Hydralazine as needed to keep blood pressure less than 180/100.  Will need resumption of antihypertensive when out of the acute phase. Not felt to be a candidate for TPA secondary to rapid resolution of symptoms. Active Problems:   Headache Likely from stroke. We'll give Tylenol for mild pain and oxycodone as needed for severe pain.   HTN (hypertension) Patient has not had blood pressure medications in over 2 months. Hydralazine when necessary and ordered.   Giant cell aortic arteritis Continue prednisone 1 mg per day.   Stage III chronic kidney disease Baseline renal function not known, but current creatinine corresponds to a GFR of 46.   DVT prophylaxis Lovenox ordered.  Code Status: Full. Family Communication: Arline AspCindy [phone]) Disposition Plan: Home when stable.  Time spent: 70 minutes  Maryruth BunChristina P Rori Goar [facility] Pager [phone] Cell: [phone]   If 7PM-7AM, please contact night-coverage [email or web address] Password [email or web address] 04/14/2013, 1:52 PM    **Disclaimer: This note was dictated with voice recognition software. Similar sounding words can inadvertently be transcribed and this note may contain transcription errors which may not have been corrected upon publication of note.**

## 2013-04-14 NOTE — ED Notes (Signed)
Neuro exam by MD.  Number 1-2.

## 2013-04-15 ENCOUNTER — Encounter (HOSPITAL_COMMUNITY): Payer: Self-pay

## 2013-04-15 DIAGNOSIS — G459 Transient cerebral ischemic attack, unspecified: Secondary | ICD-10-CM

## 2013-04-15 DIAGNOSIS — I6350 Cerebral infarction due to unspecified occlusion or stenosis of unspecified cerebral artery: Secondary | ICD-10-CM

## 2013-04-15 DIAGNOSIS — I059 Rheumatic mitral valve disease, unspecified: Secondary | ICD-10-CM

## 2013-04-15 LAB — LIPID PANEL
CHOL/HDL RATIO: 4.3 ratio
Cholesterol: 204 mg/dL — ABNORMAL HIGH (ref 0–200)
HDL: 48 mg/dL (ref >39)
LDL Cholesterol: 114 mg/dL — ABNORMAL HIGH (ref 0–99)
TRIGLYCERIDES: 209 mg/dL — AB (ref <150)
VLDL: 42 mg/dL — ABNORMAL HIGH (ref 0–40)

## 2013-04-15 LAB — SEDIMENTATION RATE: Sed Rate: 35 mm/hr — ABNORMAL HIGH (ref 0–22)

## 2013-04-15 LAB — HEMOGLOBIN A1C
Hgb A1c MFr Bld: 6.1 % — ABNORMAL HIGH (ref <5.7)
Mean Plasma Glucose: 128 mg/dL — ABNORMAL HIGH (ref <117)

## 2013-04-15 LAB — C-REACTIVE PROTEIN: CRP: 0.5 mg/dL — ABNORMAL LOW (ref <0.60)

## 2013-04-15 MED ORDER — OMEGA-3-ACID ETHYL ESTERS 1 G PO CAPS
2.0000 g | ORAL_CAPSULE | Freq: Every day | ORAL | Status: DC
  Filled 2013-04-15: qty 2

## 2013-04-15 MED ORDER — ATORVASTATIN CALCIUM 10 MG PO TABS: 10.0000 mg | ORAL_TABLET | Freq: Every day | ORAL | Status: AC

## 2013-04-15 NOTE — Progress Notes (Signed)
Patient ready for discharge home; discharge instructions given and reviewed.

## 2013-04-15 NOTE — Evaluation (Signed)
Physical Therapy Evaluation Patient Details Name: Madison Bennett MRN: [id number] DOB: 1934-09-12 Today's Date: 04/15/2013   History of Present Illness  Madison Bennett is an 79 y.o. female with a PMH of HTN (ran out of BP medication 2 months ago and never got refilled), giant cell arteritis (on steroids since 2012, currently on 1mg  Prednisone daily), who told spouse at about 0930, as they were getting ready to leave for church, that she was having trouble speaking and getting out what she wanted to say to him, but she wanted to go ahead and go on to church which was 5 minutes away. Her husband does have some dementia and memory difficulty. As pt arrived at church, friend reports she was crying because she was having difficulty speaking and was falling to the side as well. She also has complained of a right-sided headache. No visual complaints. No difficulty swallowing. Patient currently says that her speech is back to normal.  Clinical Impression  Patient at baseline, patient and family educated on BEFAST signs of stroke, as well as safety with mobility. No further acute needs, will sign off.     Follow Up Recommendations No PT follow up    Equipment Recommendations  None recommended by PT    Recommendations for Other Services       Precautions / Restrictions        Mobility  Bed Mobility Overal bed mobility: Independent                Transfers Overall transfer level: Independent                  Ambulation/Gait Ambulation/Gait assistance: Independent Ambulation Distance (Feet): 940 Feet Assistive device: None Gait Pattern/deviations: WFL(Within Functional Limits) Gait velocity: WFL Gait velocity interpretation: at or above normal speed for age/gender General Gait Details: steady with ambulation  Stairs Stairs: Yes Stairs assistance: Supervision Stair Management: One rail Left Number of Stairs: 14 General stair comments: performed without physical  assist  Wheelchair Mobility    Modified Rankin (Stroke Patients Only) Modified Rankin (Stroke Patients Only) Pre-Morbid Rankin Score: No symptoms Modified Rankin: No symptoms     Balance Overall balance assessment: No apparent balance deficits (not formally assessed)                               Standardized Balance Assessment Standardized Balance Assessment : Dynamic Gait Index   Dynamic Gait Index Level Surface: Normal Change in Gait Speed: Normal Gait with Horizontal Head Turns: Moderate Impairment Gait with Vertical Head Turns: Normal Gait and Pivot Turn: Normal Step Over Obstacle: Normal Step Around Obstacles: Normal Steps: Normal Total Score: 22       Pertinent Vitals/Pain No pain at this time    Home Living Family/patient expects to be discharged to:: Private residence Living Arrangements: Spouse/significant other   Type of Home: House Home Access: Stairs to enter Entrance Stairs-Rails: Left Entrance Stairs-Number of Steps: 14 Home Layout: Multi-level Home Equipment: Environmental consultantWalker - 2 wheels;Cane - single point;Bedside commode;Shower seat Additional Comments: tub showr and walk in but uses tub, standard    Prior Function Level of Independence: Independent               Hand Dominance   Dominant Hand: Right    Extremity/Trunk Assessment   Upper Extremity Assessment: Overall WFL for tasks assessed           Lower Extremity Assessment: Overall [facility]  for tasks assessed (+ arthritic changes)         Communication   Communication:  (cataract surgery)  Cognition Arousal/Alertness: Awake/alert Behavior During Therapy: WFL for tasks assessed/performed Overall Cognitive Status: Within Functional Limits for tasks assessed                      General Comments General comments (skin integrity, edema, etc.): educated patient and family at length regarding BEFAST stroke signs and symptoms.  Patient very appreciative.     Exercises        Assessment/Plan    PT Assessment Patent does not need any further PT services  PT Diagnosis     PT Problem List    PT Treatment Interventions     PT Goals (Current goals can be found in the Care Plan section) Acute Rehab PT Goals PT Goal Formulation: With patient/family    Frequency     Barriers to discharge        Co-evaluation               End of Session Equipment Utilized During Treatment: Gait belt Activity Tolerance: Patient tolerated treatment well Patient left: with call bell/phone within reach;with family/visitor present (seated EOB) Nurse Communication: Mobility status         Time: [id number] PT Time Calculation (min): 27 min   Charges:   PT Evaluation $Initial PT Evaluation Tier I: 1 Procedure PT Treatments $Gait Training: 8-22 mins $Self Care/Home Management: 8-22   PT G Codes:          Fabio AsaDevon J Brenda Samano 04/15/2013, 10:33 AM Charlotte Crumbevon Michaelene Dutan, PT DPT  [phone]

## 2013-04-15 NOTE — Progress Notes (Signed)
  Echocardiogram 2D Echocardiogram has been performed.  Lorn Junesngela D Burdette Gergely 04/15/2013, 10:43 AM

## 2013-04-15 NOTE — Progress Notes (Signed)
Subjective: Patient without further events overnight.  No new complaints.    Objective: Current vital signs: BP 107/62  Pulse 79  Temp(Src) 99.1 F (37.3 C) (Oral)  Resp 16  Ht 5\' 6"  (1.676 m)  Wt 68.901 kg (151 lb 14.4 oz)  BMI 24.53 kg/m2  SpO2 96% Vital signs in last 24 hours: Temp:  [97.5 F (36.4 C)-99.3 F (37.4 C)] 99.1 F (37.3 C) (04/13 0539) Pulse Rate:  [66-87] 79 (04/13 0539) Resp:  [12-18] 16 (04/13 0539) BP: (107-192)/(46-87) 107/62 mmHg (04/13 0539) SpO2:  [95 %-100 %] 96 % (04/13 0539) Weight:  [68.901 kg (151 lb 14.4 oz)] 68.901 kg (151 lb 14.4 oz) (04/12 2000)  Intake/Output from previous day: 04/12 0701 - 04/13 0700 In: 3 [I.V.:3] Out: 1 [Urine:1] Intake/Output this shift:   Nutritional status: Cardiac  Neurologic Exam: Mental Status:  Alert, oriented, thought content appropriate. Speech fluent without evidence of aphasia. Able to follow 3 step commands without difficulty.  Cranial Nerves:  II: Discs not visualized; Visual fields grossly normal, pupils equal, round, reactive to light and accommodation  III,IV, VI: ptosis not present, extra-ocular motions intact bilaterally  V,VII: smile symmetric, facial light touch sensation normal bilaterally  VIII: hearing normal bilaterally  IX,X: gag reflex present  XI: bilateral shoulder shrug  XII: midline tongue extension  Motor:  5/5 throughout Tone and bulk:normal tone throughout; no atrophy noted  Sensory: Pinprick and light touch intact throughout, bilaterally  Deep Tendon Reflexes: 2+ and symmetric with absent AJ's bilaterally  Plantars:  Right: downgoing    Left: downgoing  Cerebellar:  normal finger-to-nose and normal heel-to-shin test   Lab Results: Basic Metabolic Panel:  Recent Labs Lab 04/14/13 1104 04/14/13 1112 04/14/13 1655  NA 140 142  --   K 4.0 3.8  --   CL 102 105  --   CO2 25  --   --   GLUCOSE 89 87  --   BUN 19 19  --   CREATININE 1.14* 1.30* 1.05  CALCIUM 10.4  --    --     Liver Function Tests:  Recent Labs Lab 04/14/13 1104  AST 28  ALT 22  ALKPHOS 81  BILITOT 0.3  PROT 7.6  ALBUMIN 3.9   No results found for this basename: LIPASE, AMYLASE,  in the last 168 hours No results found for this basename: AMMONIA,  in the last 168 hours  CBC:  Recent Labs Lab 04/14/13 1104 04/14/13 1112 04/14/13 1655  WBC 9.2  --  10.9*  NEUTROABS 6.3  --   --   HGB 12.8 13.6 13.2  HCT 39.0 40.0 38.9  MCV 88.2  --  87.4  PLT 284  --  281    Cardiac Enzymes: No results found for this basename: CKTOTAL, CKMB, CKMBINDEX, TROPONINI,  in the last 168 hours  Lipid Panel:  Recent Labs Lab 04/15/13 0515  CHOL 204*  TRIG 209*  HDL 48  CHOLHDL 4.3  VLDL 42*  LDLCALC 114*    CBG:  Recent Labs Lab 04/14/13 1046  GLUCAP 85    Microbiology: No results found for this or any previous visit.  Coagulation Studies:  Recent Labs  04/14/13 1104  LABPROT 12.5  INR 0.95    Imaging: Dg Chest 2 View  04/14/2013   CLINICAL DATA:  Stroke  EXAM: CHEST  2 VIEW  COMPARISON:  DG CHEST 2V dated 06/02/2011  FINDINGS: Heart size is normal. Vascular pattern normal. No consolidation or effusion.  IMPRESSION:  No active cardiopulmonary disease.   Electronically Signed   By: Esperanza Heir M.D.   On: 04/14/2013 19:01   Ct Head Wo Contrast  04/14/2013   EXAM: CT HEAD WITHOUT CONTRAST  TECHNIQUE: Contiguous axial images were obtained from the base of the skull through the vertex without intravenous contrast.  COMPARISON:  None.  FINDINGS: There is a 3.4 x 1.8 cm area of hypodensity and loss of the normal sulci in the right parietal region. See image 18. This appearances compatible with acute to subacute infarction. Negative for intra or extra-axial hemorrhage, midline shift, hydrocephalus, or evidence of mass lesion. Visualized paranasal sinuses and mastoid air cells are clear. There is atherosclerotic calcification of the intracranial internal carotid arteries. The  skull is intact. Soft tissues of the scalp are symmetric. Postsurgical changes of both orbits.  IMPRESSION: Hypodensity in the right parietal lobe is compatible with acute to subacute stroke. Critical Value/emergent results were called by telephone at the time of interpretation on 04/14/2013 at 11:25 AM to Dr. Quita Skye , who verbally acknowledged these results.  Negative for hemorrhage, midline shift, or hydrocephalus.   Electronically Signed   By: Britta Mccreedy M.D.   On: 04/14/2013 11:26   Mr Brain Wo Contrast  04/14/2013   CLINICAL DATA:  Difficulty with speech. Hypertension. Giant cell arteritis.  EXAM: MRI HEAD WITHOUT CONTRAST  MRA HEAD WITHOUT CONTRAST  TECHNIQUE: Multiplanar, multiecho pulse sequences of the brain and surrounding structures were obtained without intravenous contrast. Angiographic images of the head were obtained using MRA technique without contrast.  COMPARISON:  04/14/2013 and 05/12/2006 CT.  No comparison MR.  FINDINGS: MRI HEAD FINDINGS  No acute infarct.  No intracranial hemorrhage.  Patchy white matter type changes most notable periatrial region and greater on the right probably related to result of small vessel disease given patient's history.  Series 7, image 12 with probable artifact occipital lobes although in the proper clinical setting, subtle changes of posterior reversible encephalopathy syndrome could not be completely excluded.  No intracranial mass lesion noted on this unenhanced exam.  No hydrocephalus  Mild transverse ligament hypertrophy. Cervical medullary junction, pituitary region, pineal region and orbital structures unremarkable.  Minimal paranasal sinus mucosal thickening. Degenerative changes C1-2 articulation greater on the left.  MRA HEAD FINDINGS  Anterior circulation without medium or large size vessel significant stenosis or occlusion.  Ectatic dominant right vertebral artery. Left vertebral artery is mildly ectatic.  Mildly ectatic basilar artery without  significant stenosis.  Nonvisualized left posterior inferior cerebellar artery may be related to low origin of large left anterior inferior cerebellar artery.  Mild branch vessel irregularity.  No aneurysm noted.  IMPRESSION: MRI HEAD:  No acute infarct.  Patchy white matter type changes most notable periatrial region and greater on the right probably related to result of small vessel disease given patient's history.  Series 7, image 12 with probable artifact occipital lobes although in the proper clinical setting, subtle changes of posterior reversible encephalopathy syndrome could not be completely excluded.  MRA HEAD :  No medium or large size vessel significant stenosis or occlusion.  Mild branch vessel irregularity.  Please see above.   Electronically Signed   By: Bridgett Larsson M.D.   On: 04/14/2013 20:02   Mr Maxine Glenn Head/brain Wo Cm  04/14/2013   CLINICAL DATA:  Difficulty with speech. Hypertension. Giant cell arteritis.  EXAM: MRI HEAD WITHOUT CONTRAST  MRA HEAD WITHOUT CONTRAST  TECHNIQUE: Multiplanar, multiecho pulse sequences of the brain and  surrounding structures were obtained without intravenous contrast. Angiographic images of the head were obtained using MRA technique without contrast.  COMPARISON:  04/14/2013 and 05/12/2006 CT.  No comparison MR.  FINDINGS: MRI HEAD FINDINGS  No acute infarct.  No intracranial hemorrhage.  Patchy white matter type changes most notable periatrial region and greater on the right probably related to result of small vessel disease given patient's history.  Series 7, image 12 with probable artifact occipital lobes although in the proper clinical setting, subtle changes of posterior reversible encephalopathy syndrome could not be completely excluded.  No intracranial mass lesion noted on this unenhanced exam.  No hydrocephalus  Mild transverse ligament hypertrophy. Cervical medullary junction, pituitary region, pineal region and orbital structures unremarkable.  Minimal  paranasal sinus mucosal thickening. Degenerative changes C1-2 articulation greater on the left.  MRA HEAD FINDINGS  Anterior circulation without medium or large size vessel significant stenosis or occlusion.  Ectatic dominant right vertebral artery. Left vertebral artery is mildly ectatic.  Mildly ectatic basilar artery without significant stenosis.  Nonvisualized left posterior inferior cerebellar artery may be related to low origin of large left anterior inferior cerebellar artery.  Mild branch vessel irregularity.  No aneurysm noted.  IMPRESSION: MRI HEAD:  No acute infarct.  Patchy white matter type changes most notable periatrial region and greater on the right probably related to result of small vessel disease given patient's history.  Series 7, image 12 with probable artifact occipital lobes although in the proper clinical setting, subtle changes of posterior reversible encephalopathy syndrome could not be completely excluded.  MRA HEAD :  No medium or large size vessel significant stenosis or occlusion.  Mild branch vessel irregularity.  Please see above.   Electronically Signed   By: Bridgett LarssonSteve  Olson M.D.   On: 04/14/2013 20:02    Medications:  I have reviewed the patient's current medications. Scheduled: . aspirin  300 mg Rectal Daily   Or  . aspirin  325 mg Oral Daily  . calcium-vitamin D  1 tablet Oral Q breakfast  . enoxaparin (LOVENOX) injection  30 mg Subcutaneous Q12H  . fish oil-omega-3 fatty acids  2 g Oral Daily  . multivitamin with minerals  1 tablet Oral Daily  . predniSONE  1 mg Oral Q breakfast  . sodium chloride  3 mL Intravenous Q12H    Assessment/Plan: Patient at baseline.  MRI of the brain reviewed and shows no evidence of acute ischemic disease.  Some chronic changes noted.  BP controlled.  Patient on an aspirin a day.  A1c, echocardiogram and carotid dopplers pending.   LDL 114.    Recommendations: 1. Continue ASA 2.  Will continue to follow up results of TIA work  up. 3.  Statin therapy    LOS: 1 day   Thana FarrLeslie Latorsha Curling, MD [facility] [phone] 04/15/2013  9:52 AM

## 2013-04-15 NOTE — Progress Notes (Signed)
Occupational Therapy Discharge Patient Details Name: Madison Bennett MRN: [id number] DOB: [birth year] Today's Date: 04/15/2013 Time:  -     .PT EVALUATION / OT SIGN-OFF  OT order received and patient screened with no acute OT needs at this time. [name].  If acute OT needs arise, OT will evaluate. Please refer to discharge planning section for recommendations for equipment. Thank you       Harolyn RutherfordJessica B Neave Lenger Pager: [phone]: [phone]  04/15/2013, 9:45 AM

## 2013-04-15 NOTE — Progress Notes (Signed)
VASCULAR LAB PRELIMINARY  PRELIMINARY  PRELIMINARY  PRELIMINARY  Carotid Dopplers completed.    Preliminary report:  1-39% ICA stenosis.  Vertebral artery flow is antegrade.  Kern AlbertaCandace R Aphrodite Harpenau, RVT 04/15/2013, 11:02 AM

## 2013-04-15 NOTE — Discharge Summary (Addendum)
Physician Discharge Summary  Madison Bennett ZOX:[id number] DOB: 1934/11/24 DOA: 04/14/2013  PCP: Astrid DivineGRIFFIN,ELAINE COLLINS, MD  Admit date: 04/14/2013 Discharge date: 04/15/2013  Time spent: >45 minutes    Discharge Diagnoses:  Principal Problem:   TIA (transient ischemic attack) Active Problems:   Headache   HTN (hypertension)   Giant cell aortic arteritis   Discharge Condition: stable  Diet recommendation: heart healthy diet  Filed Weights   04/14/13 1455 04/14/13 2000  Weight: 68.901 kg (151 lb 14.4 oz) 68.901 kg (151 lb 14.4 oz)    History of present illness:  Madison Bennett is an 79 y.o. female with a PMH of HTN (ran out of BP medication 2 months ago and never got refilled), giant cell arteritis (on steroids since 2012, currently on 1mg  Prednisone daily), who told spouse at about 0930, as they were getting ready to leave for church, that she was having trouble speaking and getting out what she wanted to say to him, but she wanted to go ahead and go on to church which was 5 minutes away. Her husband does have some dementia and memory difficulty. As pt arrived at church, friend reports she was crying because she was having difficulty speaking and was falling to the side as well. She also has complained of a right-sided headache. No visual complaints. No difficulty swallowing. Patient currently says that her speech is back to normal.   Hospital Course:  TIA - MRI negative for CVA - carotid doppler without stenosis  - ECHO showing only grade 1 diastolic dysfunction - cont ASA 81 mg - Plan discussed with Dr Thad Rangereynolds and explained to the patient in detail   HTN - resume benazepril/HCTZ  Hyperlipidemia - start Lipitor  Giant Cell Arteritis - cont Prednisone at current dose  Stage 3 CKD - stable   Consultations:  Neuro  Discharge Exam: Filed Vitals:   04/15/13 1341  BP: 107/60  Pulse: 79  Temp: 97.8 F (36.6 C)  Resp: 16    General: AAO x 3, speech  normal  Cardiovascular: RRR, no murmurs Respiratory: CTA b/l  Neuro: no focal deficits  Discharge Instructions You were cared for by a hospitalist during your hospital stay. If you have any questions about your discharge medications or the care you received while you were in the hospital after you are discharged, you can call the unit and asked to speak with the hospitalist on call if the hospitalist that took care of you is not available. Once you are discharged, your primary care physician will handle any further medical issues. Please note that NO REFILLS for any discharge medications will be authorized once you are discharged, as it is imperative that you return to your primary care physician (or establish a relationship with a primary care physician if you do not have one) for your aftercare needs so that they can reassess your need for medications and monitor your lab values.      Discharge Orders   Future Orders Complete By Expires   Diet - low sodium heart healthy  As directed    Increase activity slowly  As directed        Medication List         aspirin 81 MG tablet  Take 81 mg by mouth daily.     atorvastatin 10 MG tablet  Commonly known as:  LIPITOR  Take 1 tablet (10 mg total) by mouth daily.     benazepril-hydrochlorthiazide 10-12.5 MG per tablet  Commonly known as:  LOTENSIN HCT  Take 1 tablet by mouth daily.     CALCIUM 600 + D PO  Take 1 tablet by mouth 2 (two) times daily.     CENTRUM SILVER PO  Take 1 tablet by mouth daily.     fish oil-omega-3 fatty acids 1000 MG capsule  Take 2 g by mouth daily.     Glucosamine HCl 1500 MG Tabs  Take 1,500 tablets by mouth at bedtime.     predniSONE 1 MG tablet  Commonly known as:  DELTASONE  Take 1 mg by mouth daily with breakfast.       Allergies  Allergen Reactions  . Tetanus Toxoids Swelling      The results of significant diagnostics from this hospitalization (including imaging, microbiology, ancillary  and laboratory) are listed below for reference.    Significant Diagnostic Studies: Dg Chest 2 View  04/14/2013   CLINICAL DATA:  Stroke  EXAM: CHEST  2 VIEW  COMPARISON:  DG CHEST 2V dated 06/02/2011  FINDINGS: Heart size is normal. Vascular pattern normal. No consolidation or effusion.  IMPRESSION: No active cardiopulmonary disease.   Electronically Signed   By: Esperanza Heiraymond  Rubner M.D.   On: 04/14/2013 19:01   Ct Head Wo Contrast  04/14/2013   EXAM: CT HEAD WITHOUT CONTRAST  TECHNIQUE: Contiguous axial images were obtained from the base of the skull through the vertex without intravenous contrast.  COMPARISON:  None.  FINDINGS: There is a 3.4 x 1.8 cm area of hypodensity and loss of the normal sulci in the right parietal region. See image 18. This appearances compatible with acute to subacute infarction. Negative for intra or extra-axial hemorrhage, midline shift, hydrocephalus, or evidence of mass lesion. Visualized paranasal sinuses and mastoid air cells are clear. There is atherosclerotic calcification of the intracranial internal carotid arteries. The skull is intact. Soft tissues of the scalp are symmetric. Postsurgical changes of both orbits.  IMPRESSION: Hypodensity in the right parietal lobe is compatible with acute to subacute stroke. Critical Value/emergent results were called by telephone at the time of interpretation on 04/14/2013 at 11:25 AM to Dr. Quita SkyeMICHAEL GHIM , who verbally acknowledged these results.  Negative for hemorrhage, midline shift, or hydrocephalus.   Electronically Signed   By: Britta MccreedySusan  Turner M.D.   On: 04/14/2013 11:26   Mr Brain Wo Contrast  04/14/2013   CLINICAL DATA:  Difficulty with speech. Hypertension. Giant cell arteritis.  EXAM: MRI HEAD WITHOUT CONTRAST  MRA HEAD WITHOUT CONTRAST  TECHNIQUE: Multiplanar, multiecho pulse sequences of the brain and surrounding structures were obtained without intravenous contrast. Angiographic images of the head were obtained using MRA  technique without contrast.  COMPARISON:  04/14/2013 and 05/12/2006 CT.  No comparison MR.  FINDINGS: MRI HEAD FINDINGS  No acute infarct.  No intracranial hemorrhage.  Patchy white matter type changes most notable periatrial region and greater on the right probably related to result of small vessel disease given patient's history.  Series 7, image 12 with probable artifact occipital lobes although in the proper clinical setting, subtle changes of posterior reversible encephalopathy syndrome could not be completely excluded.  No intracranial mass lesion noted on this unenhanced exam.  No hydrocephalus  Mild transverse ligament hypertrophy. Cervical medullary junction, pituitary region, pineal region and orbital structures unremarkable.  Minimal paranasal sinus mucosal thickening. Degenerative changes C1-2 articulation greater on the left.  MRA HEAD FINDINGS  Anterior circulation without medium or large size vessel significant stenosis or occlusion.  Ectatic dominant right vertebral artery. Left  vertebral artery is mildly ectatic.  Mildly ectatic basilar artery without significant stenosis.  Nonvisualized left posterior inferior cerebellar artery may be related to low origin of large left anterior inferior cerebellar artery.  Mild branch vessel irregularity.  No aneurysm noted.  IMPRESSION: MRI HEAD:  No acute infarct.  Patchy white matter type changes most notable periatrial region and greater on the right probably related to result of small vessel disease given patient's history.  Series 7, image 12 with probable artifact occipital lobes although in the proper clinical setting, subtle changes of posterior reversible encephalopathy syndrome could not be completely excluded.  MRA HEAD :  No medium or large size vessel significant stenosis or occlusion.  Mild branch vessel irregularity.  Please see above.   Electronically Signed   By: Bridgett Larsson M.D.   On: 04/14/2013 20:02   Mr Maxine Glenn Head/brain Wo Cm  04/14/2013    CLINICAL DATA:  Difficulty with speech. Hypertension. Giant cell arteritis.  EXAM: MRI HEAD WITHOUT CONTRAST  MRA HEAD WITHOUT CONTRAST  TECHNIQUE: Multiplanar, multiecho pulse sequences of the brain and surrounding structures were obtained without intravenous contrast. Angiographic images of the head were obtained using MRA technique without contrast.  COMPARISON:  04/14/2013 and 05/12/2006 CT.  No comparison MR.  FINDINGS: MRI HEAD FINDINGS  No acute infarct.  No intracranial hemorrhage.  Patchy white matter type changes most notable periatrial region and greater on the right probably related to result of small vessel disease given patient's history.  Series 7, image 12 with probable artifact occipital lobes although in the proper clinical setting, subtle changes of posterior reversible encephalopathy syndrome could not be completely excluded.  No intracranial mass lesion noted on this unenhanced exam.  No hydrocephalus  Mild transverse ligament hypertrophy. Cervical medullary junction, pituitary region, pineal region and orbital structures unremarkable.  Minimal paranasal sinus mucosal thickening. Degenerative changes C1-2 articulation greater on the left.  MRA HEAD FINDINGS  Anterior circulation without medium or large size vessel significant stenosis or occlusion.  Ectatic dominant right vertebral artery. Left vertebral artery is mildly ectatic.  Mildly ectatic basilar artery without significant stenosis.  Nonvisualized left posterior inferior cerebellar artery may be related to low origin of large left anterior inferior cerebellar artery.  Mild branch vessel irregularity.  No aneurysm noted.  IMPRESSION: MRI HEAD:  No acute infarct.  Patchy white matter type changes most notable periatrial region and greater on the right probably related to result of small vessel disease given patient's history.  Series 7, image 12 with probable artifact occipital lobes although in the proper clinical setting, subtle changes  of posterior reversible encephalopathy syndrome could not be completely excluded.  MRA HEAD :  No medium or large size vessel significant stenosis or occlusion.  Mild branch vessel irregularity.  Please see above.   Electronically Signed   By: Bridgett Larsson M.D.   On: 04/14/2013 20:02    Microbiology: No results found for this or any previous visit (from the past 240 hour(s)).   Labs: Basic Metabolic Panel:  Recent Labs Lab 04/14/13 1104 04/14/13 1112 04/14/13 1655  NA 140 142  --   K 4.0 3.8  --   CL 102 105  --   CO2 25  --   --   GLUCOSE 89 87  --   BUN 19 19  --   CREATININE 1.14* 1.30* 1.05  CALCIUM 10.4  --   --    Liver Function Tests:  Recent Labs Lab 04/14/13 1104  AST 28  ALT 22  ALKPHOS 81  BILITOT 0.3  PROT 7.6  ALBUMIN 3.9   No results found for this basename: LIPASE, AMYLASE,  in the last 168 hours No results found for this basename: AMMONIA,  in the last 168 hours CBC:  Recent Labs Lab 04/14/13 1104 04/14/13 1112 04/14/13 1655  WBC 9.2  --  10.9*  NEUTROABS 6.3  --   --   HGB 12.8 13.6 13.2  HCT 39.0 40.0 38.9  MCV 88.2  --  87.4  PLT 284  --  281   Cardiac Enzymes: No results found for this basename: CKTOTAL, CKMB, CKMBINDEX, TROPONINI,  in the last 168 hours BNP: BNP (last 3 results) No results found for this basename: PROBNP,  in the last 8760 hours CBG:  Recent Labs Lab 04/14/13 1046  GLUCAP 85       Signed:  Calvert Cantor, MD  [facility] 04/15/2013, 5:18 PM

## 2013-04-15 NOTE — Progress Notes (Signed)
SLP Cancellation Note  Patient Details Name: Madison Bennett MRN: [id number] DOB: 1934/02/19   Cancelled treatment:         Full evaluation deferred.  Brief screen reveals Speech/language to be WNL.  Pt. And family agree that speech is back to baseline.  No f/u ST needed at this time.   Lenor DerrickLori T Blaize Epple 04/15/2013, 11:27 AM

## 2015-04-23 IMAGING — CT CT HEAD W/O CM
2 series · 15 of 30 positions shown, 19 images · non-contrast
Comparison: None.

EXAM:
CT HEAD WITHOUT CONTRAST
TECHNIQUE: Contiguous axial images were obtained from the base of the skull
through the vertex without intravenous contrast.

[Series 2: head w/o · axial · non-contrast · 0.41mm/px · z∈[+1549,+1664]mm · 13 of 27 slices shown, 17 images]
[im 2/27  brain]
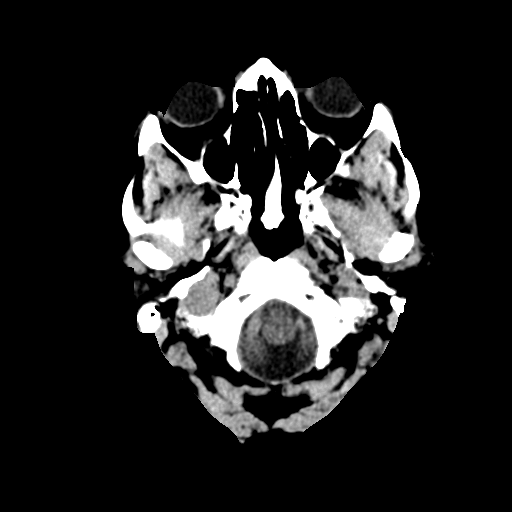
[im 2/27  bone]
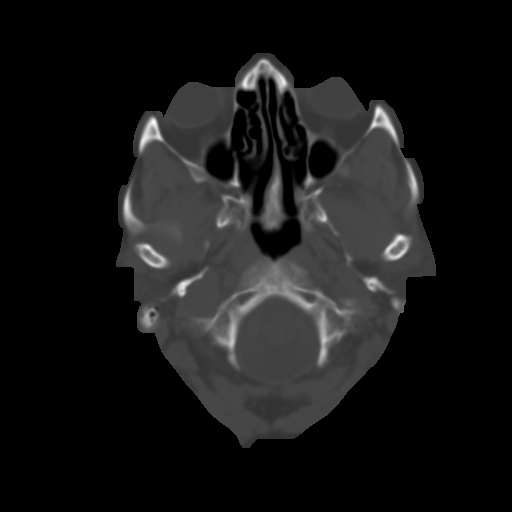
[im 4/27  brain]
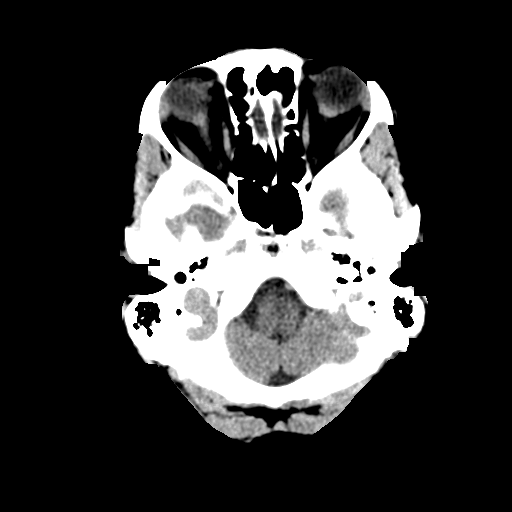
[im 6/27  brain]
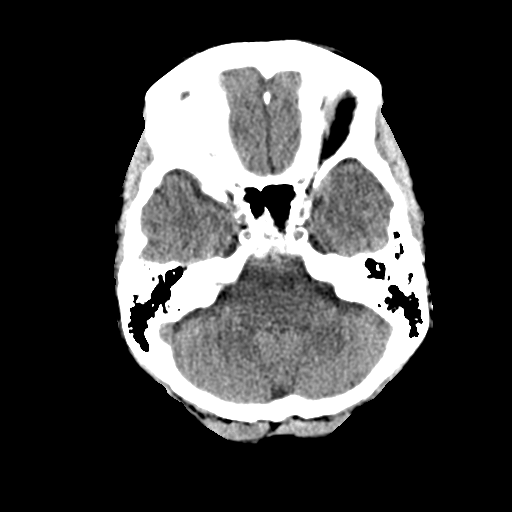
[im 8/27  brain]
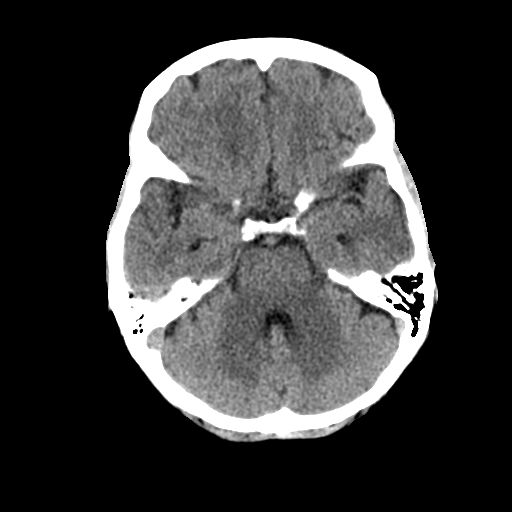
[im 10/27  brain]
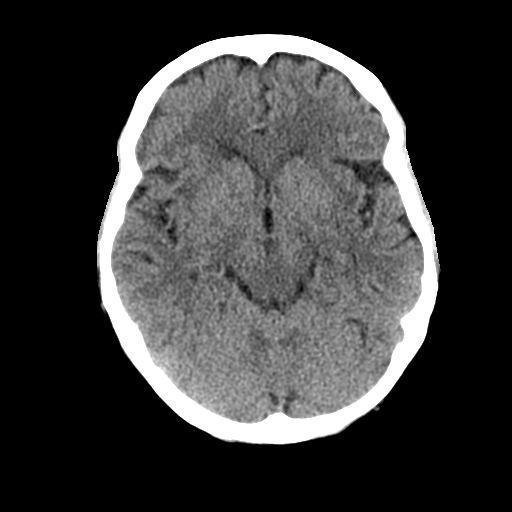
[im 10/27  bone]
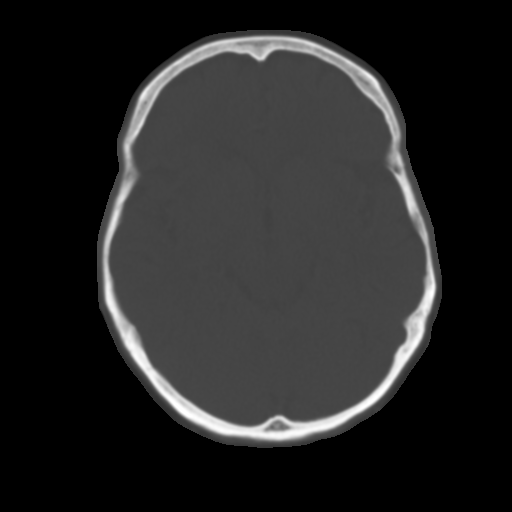
[im 12/27  brain]
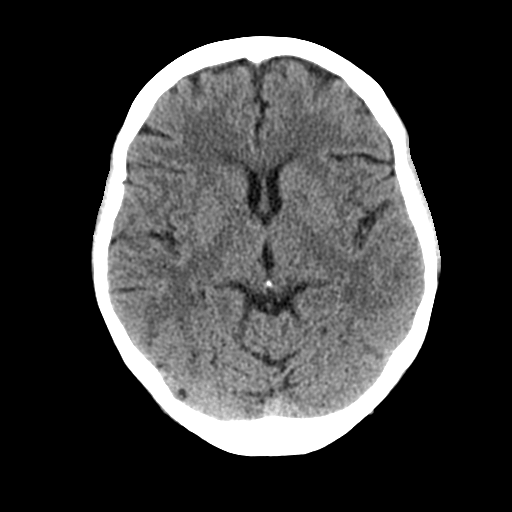
[im 14/27  brain]
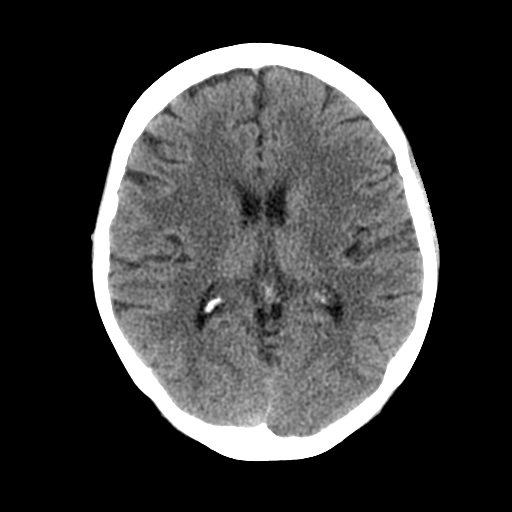
[im 15/27  brain]
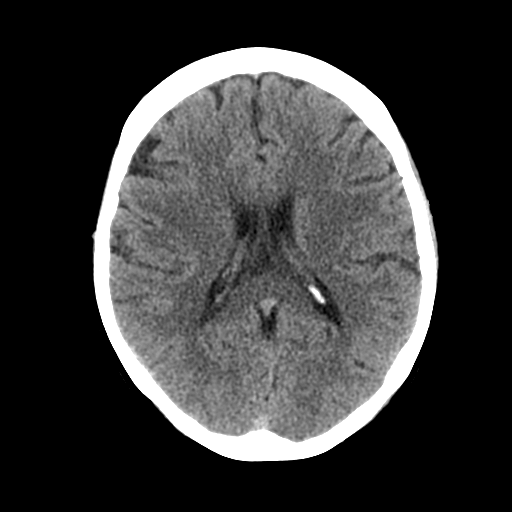
[im 17/27  brain]
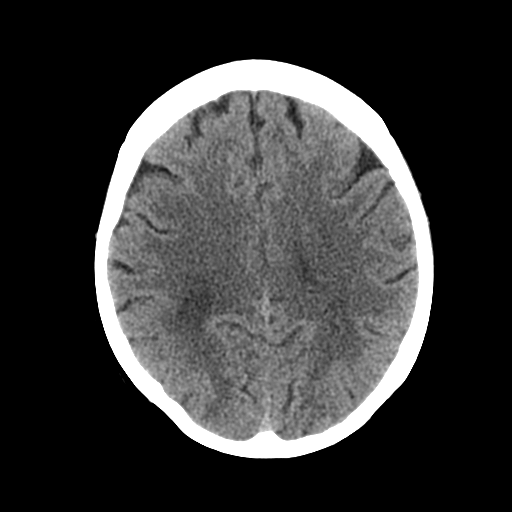
[im 17/27  bone]
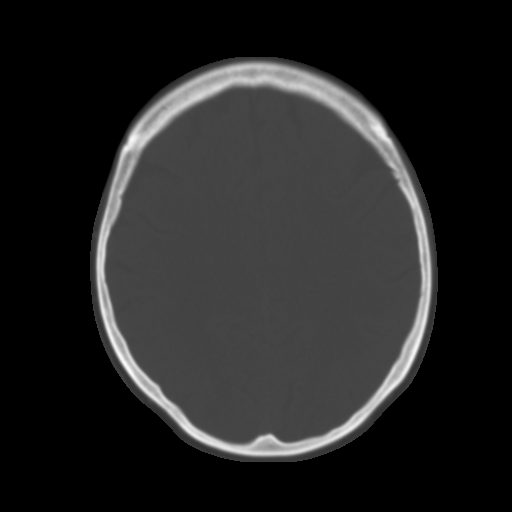
[im 19/27  brain]
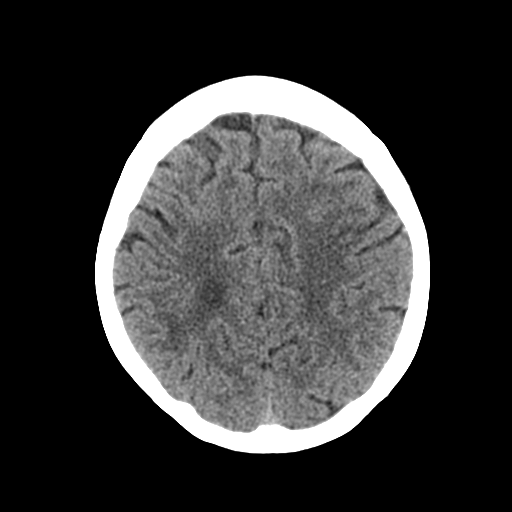
[im 21/27  brain]
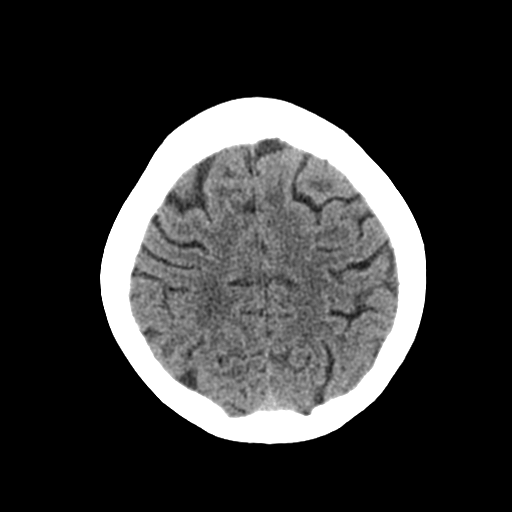
[im 23/27  brain]
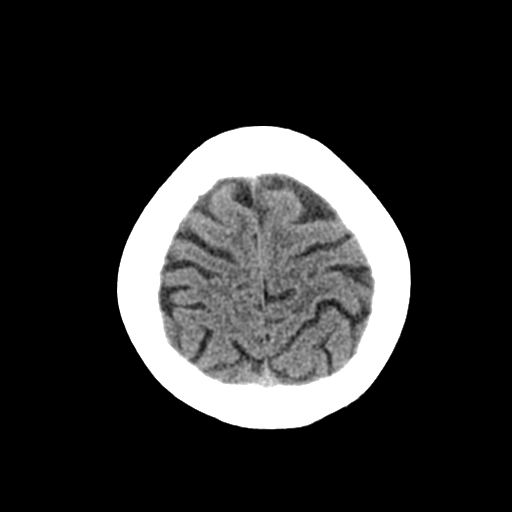
[im 25/27  brain]
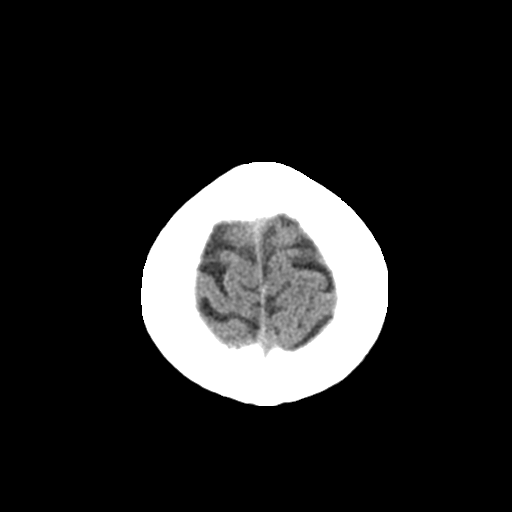
[im 25/27  bone]
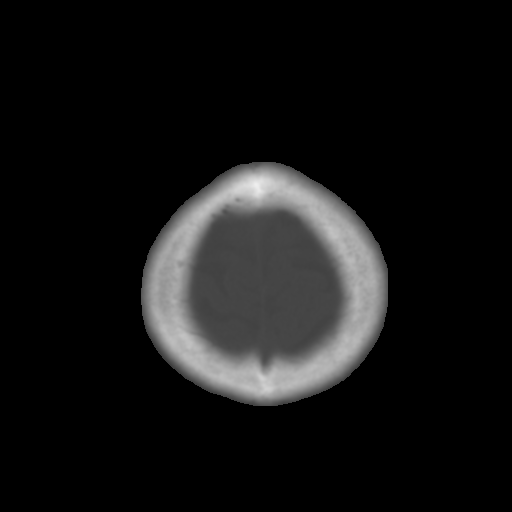

[Series 3: bone windows · axial · 0.41mm/px · z∈[+1549,+1569]mm · 2 of 27 slices shown]
[im 2/27  bone]
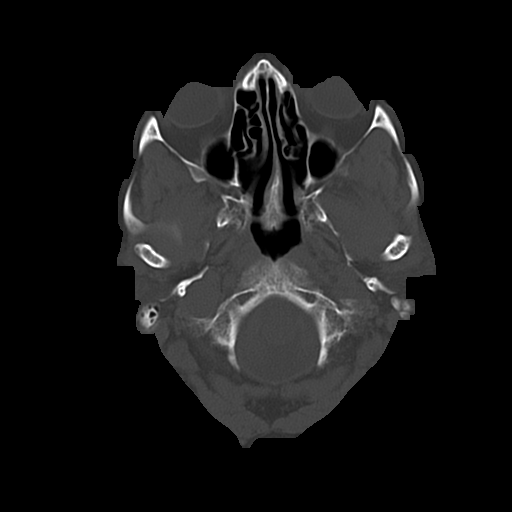
[im 6/27  bone]
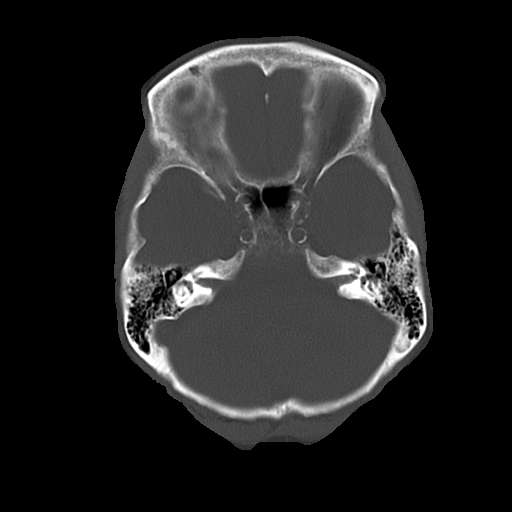

[15 of 30 positions shown; findings below may reference images not displayed]

FINDINGS: There is a 3.4 x 1.8 cm area of hypodensity and loss of the normal
sulci in the right parietal region. See image 18. This appearances
compatible with acute to subacute infarction. Negative for intra or
extra-axial hemorrhage, midline shift, hydrocephalus, or evidence of
mass lesion. Visualized paranasal sinuses and mastoid air cells are
clear. There is atherosclerotic calcification of the intracranial
internal carotid arteries. The skull is intact. Soft tissues of the
scalp are symmetric. Postsurgical changes of both orbits.
IMPRESSION: Hypodensity in the right parietal lobe is compatible with acute to
subacute stroke. Critical Value/emergent results were called by
telephone at the time of interpretation on 04/14/2013 at [DATE] to
Dr. KIKO BREAULT , who verbally acknowledged these results.

Negative for hemorrhage, midline shift, or hydrocephalus.

## 2015-04-23 IMAGING — CR DG CHEST 2V
2 series · 2 of 2 positions shown · non-contrast
Comparison: DG CHEST 2V dated 06/02/2011

CLINICAL DATA: Stroke

EXAM:
CHEST  2 VIEW

[w chest pa]
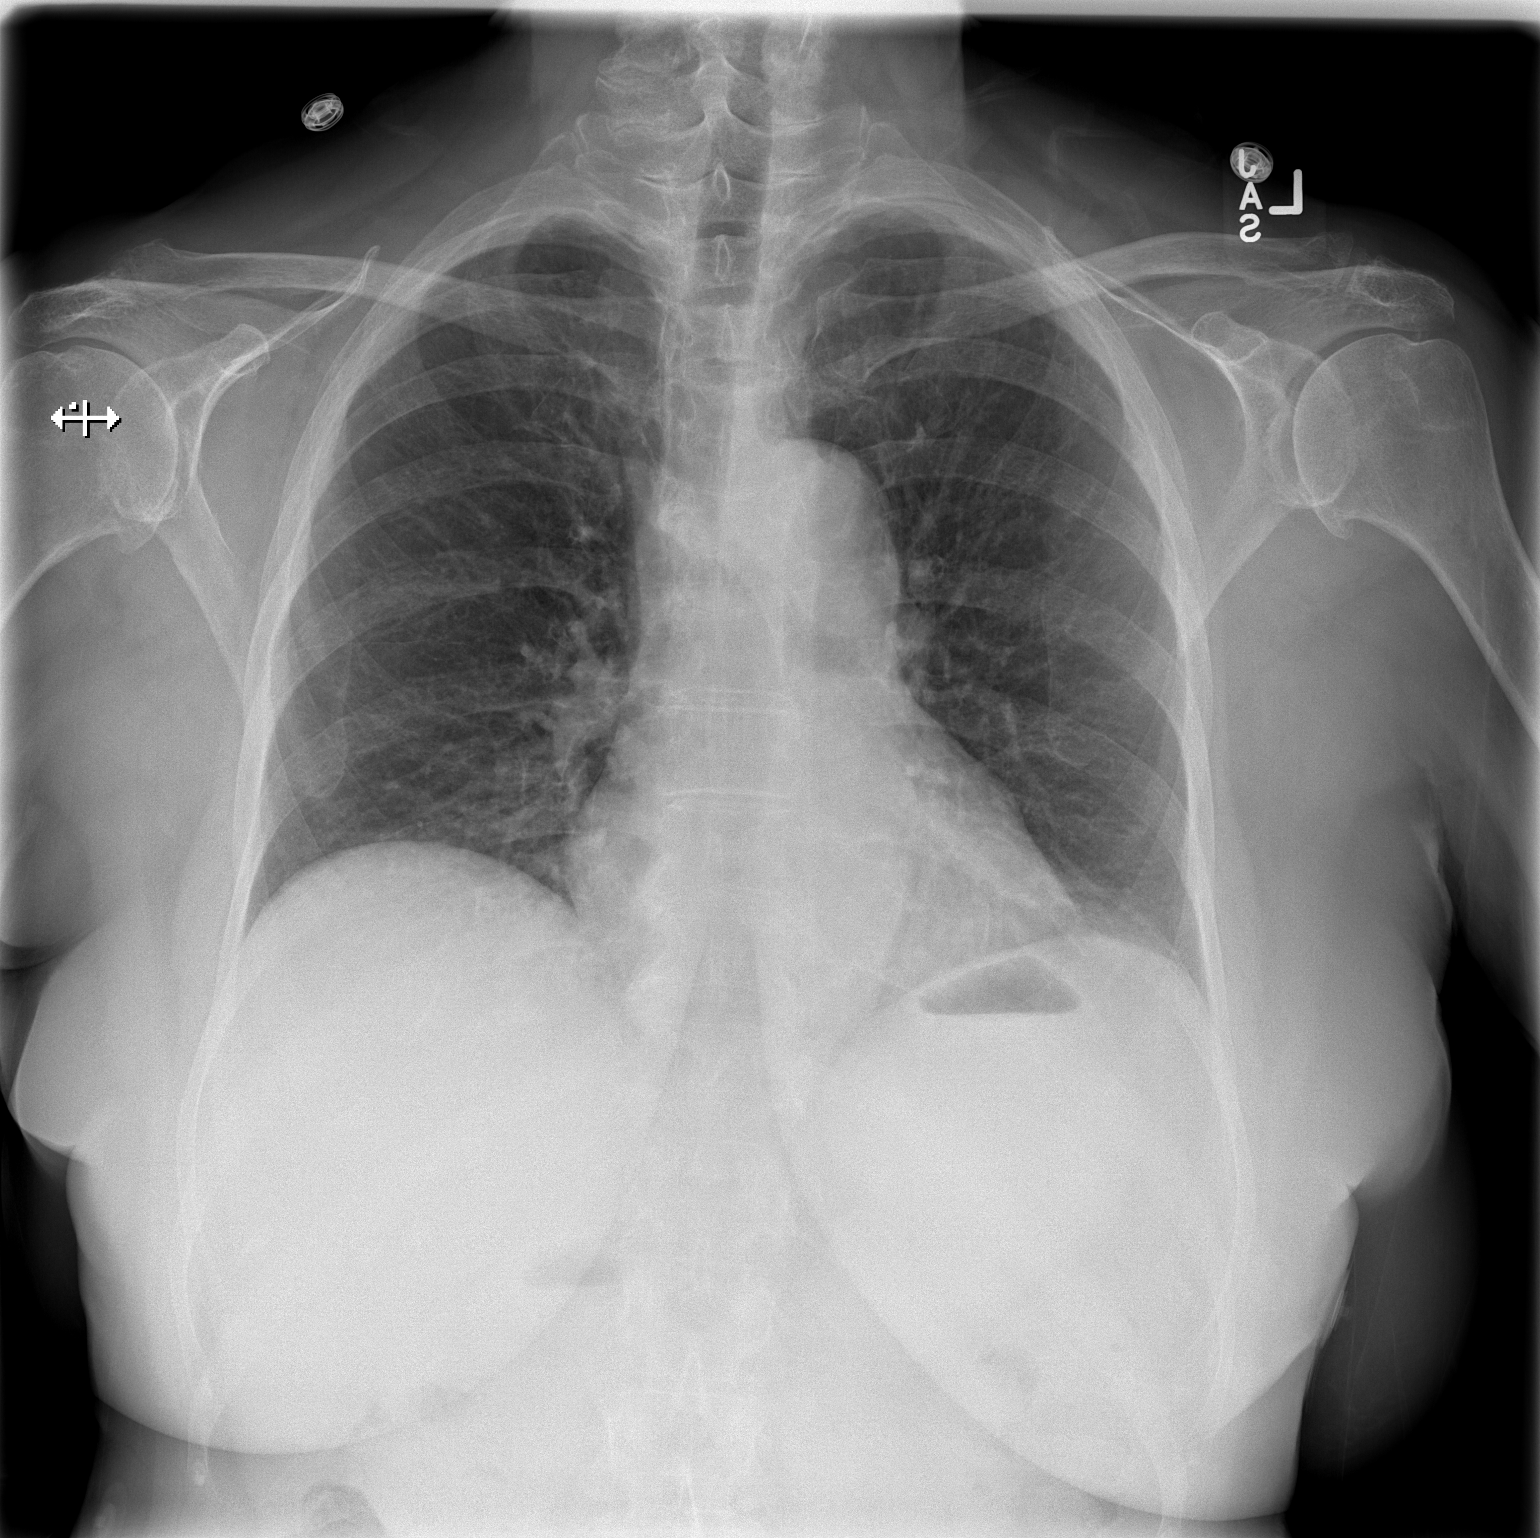

[w chest lat]
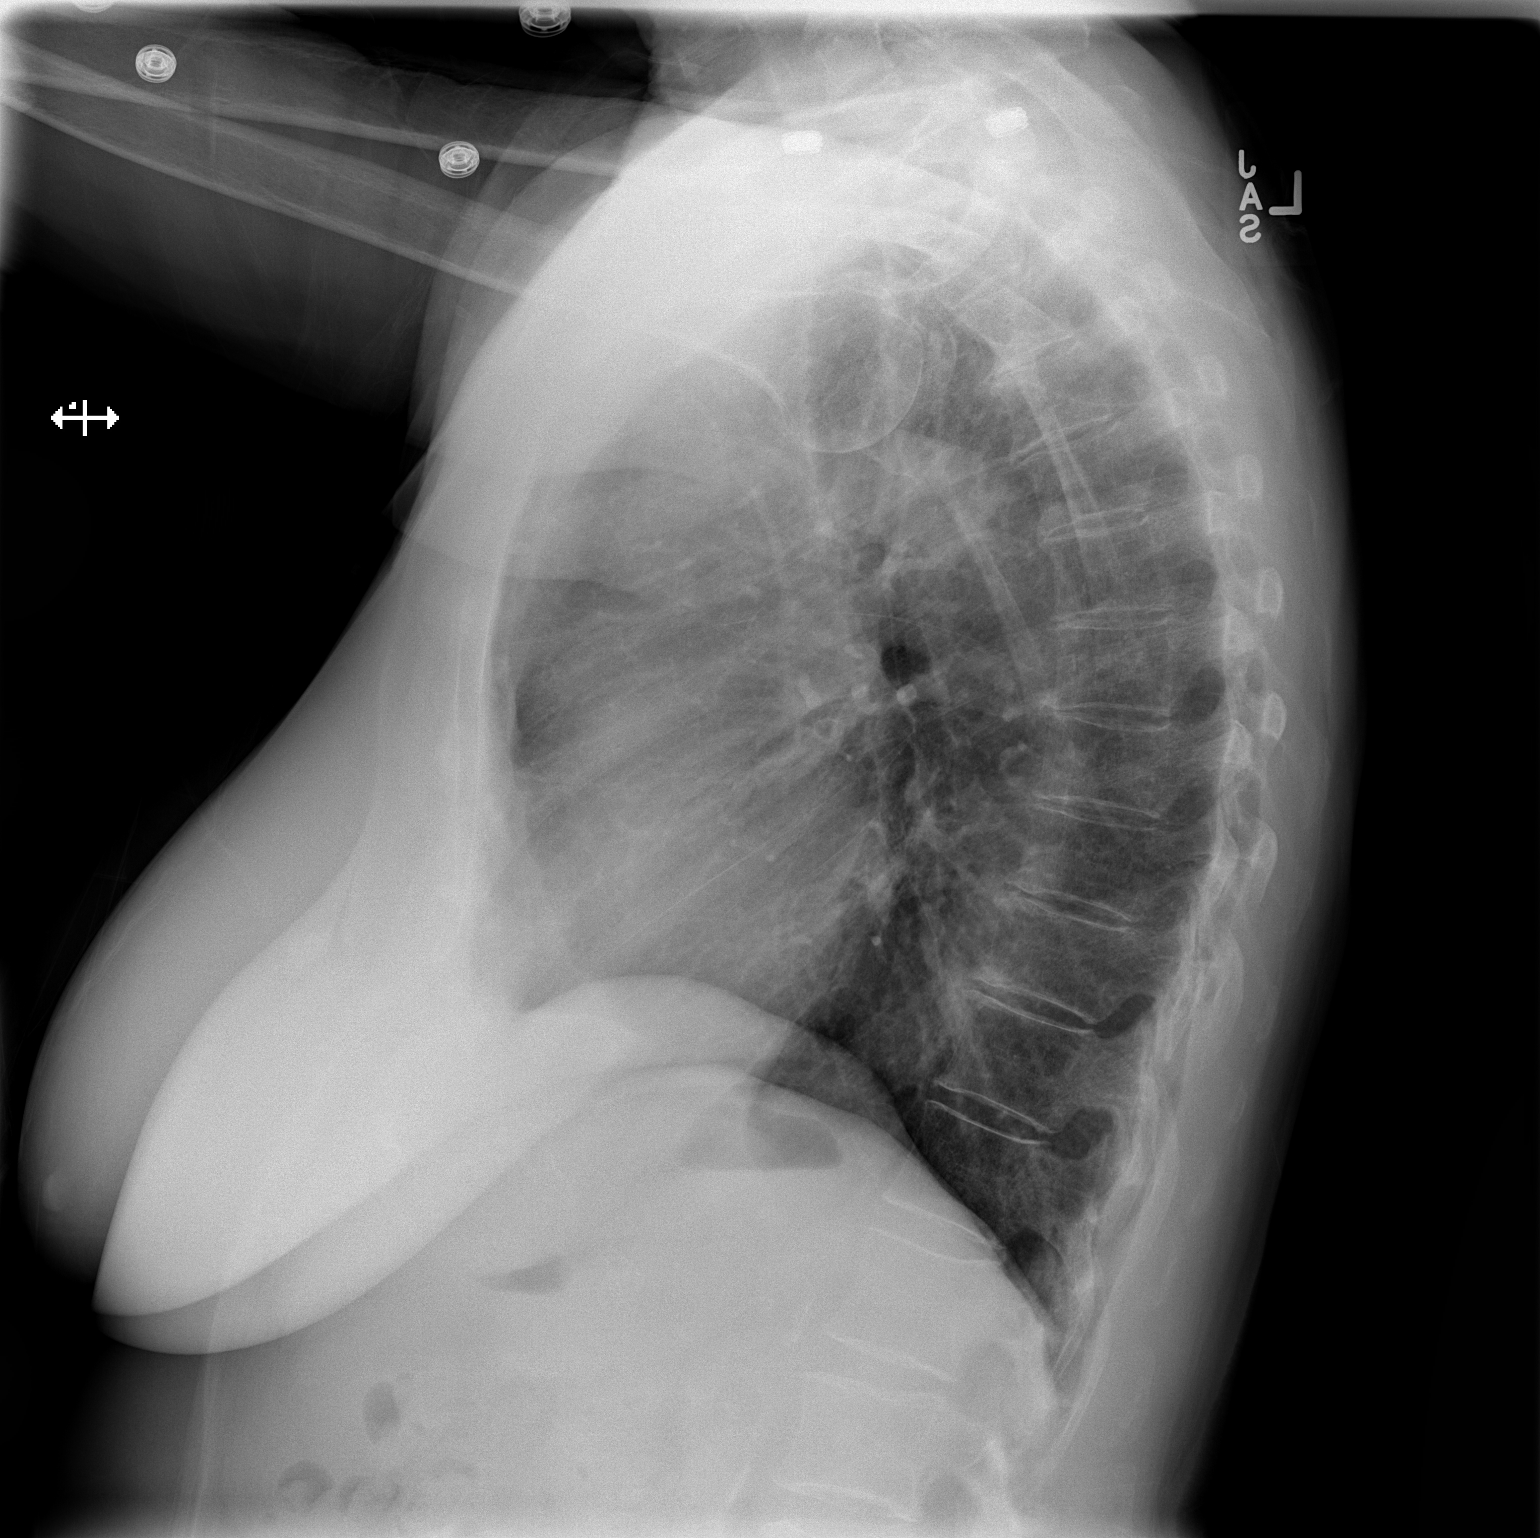

[2 of 2 positions shown; findings below may reference images not displayed]

FINDINGS: Heart size is normal. Vascular pattern normal. No consolidation or
effusion.
IMPRESSION: No active cardiopulmonary disease.

## 2018-06-02 ENCOUNTER — Emergency Department (HOSPITAL_COMMUNITY): Payer: Medicare Other

## 2018-06-02 ENCOUNTER — Emergency Department (HOSPITAL_COMMUNITY)
Admission: EM | Admit: 2018-06-02 | Discharge: 2018-06-03 | Disposition: A | Discharge status: Home / Self Care | Payer: Medicare Other | Attending: Emergency Medicine | Admitting: Emergency Medicine

## 2018-06-02 ENCOUNTER — Other Ambulatory Visit: Payer: Self-pay

## 2018-06-02 DIAGNOSIS — Z7982 Long term (current) use of aspirin: Secondary | ICD-10-CM | POA: Diagnosis not present

## 2018-06-02 DIAGNOSIS — Z79899 Other long term (current) drug therapy: Secondary | ICD-10-CM | POA: Diagnosis not present

## 2018-06-02 DIAGNOSIS — M25551 Pain in right hip: Secondary | ICD-10-CM | POA: Diagnosis present

## 2018-06-02 DIAGNOSIS — Z96641 Presence of right artificial hip joint: Secondary | ICD-10-CM | POA: Diagnosis not present

## 2018-06-02 DIAGNOSIS — Z96649 Presence of unspecified artificial hip joint: Secondary | ICD-10-CM

## 2018-06-02 DIAGNOSIS — M978XXA Periprosthetic fracture around other internal prosthetic joint, initial encounter: Secondary | ICD-10-CM | POA: Insufficient documentation

## 2018-06-02 DIAGNOSIS — Z8673 Personal history of transient ischemic attack (TIA), and cerebral infarction without residual deficits: Secondary | ICD-10-CM | POA: Diagnosis not present

## 2018-06-02 DIAGNOSIS — I10 Essential (primary) hypertension: Secondary | ICD-10-CM | POA: Diagnosis not present

## 2018-06-02 MED ORDER — FENTANYL CITRATE (PF) 100 MCG/2ML IJ SOLN: 50.0000 ug | Freq: Once | INTRAMUSCULAR | Status: DC | PRN

## 2018-06-02 MED ORDER — HYDROCODONE-ACETAMINOPHEN 5-325 MG PO TABS
1.0000 | ORAL_TABLET | Freq: Once | ORAL | Status: AC
  Administered 2018-06-02: 1 via ORAL
  Filled 2018-06-02: qty 1

## 2018-06-02 NOTE — ED Notes (Signed)
Report given to facility, staff member hung up as I was asking her to repeat her name.

## 2018-06-02 NOTE — Discharge Instructions (Addendum)
Your CT scan shows a fracture of the bone around your hip prosthesis.  You may do something called "limited weightbearing".  It would be best for you to stay off of your right leg as much as possible.  Use a wheelchair for this.  If you need to stand to pivot to get into the bed or in the bathroom then this is okay with a walker.  Otherwise, stay off your right leg.  You may follow-up with your orthopedic surgeon at [facility] or I have referred you to an orthopedist in [place].    If your pain becomes unbearable or worse, you notice weakness or numbness in your leg, or any other new/concerning symptoms then return to the ER for evaluation.

## 2018-06-02 NOTE — ED Notes (Signed)
[facility] called for transportation  

## 2018-06-02 NOTE — ED Notes (Signed)
Bed: IW80 Expected date:  Expected time:  Means of arrival:  Comments: EMS/fx hip

## 2018-06-02 NOTE — ED Provider Notes (Signed)
[facility]-EMERGENCY DEPT Provider Note   CSN: [id number] Arrival date & time: 06/02/18  1536    History   Chief Complaint Chief Complaint  Patient presents with  . Hip Pain    HPI Madison Bennett is a 84 y.o. female.     HPI  84 year old female presents with right hip pain.  Yesterday evening around 6 PM she was pulling a heavy chair to put it back into place and she fell.  She landed on her right hip.  She thinks she also hit her head but has not had any headache and did not lose consciousness.  She is not on a blood thinner.  She has no pain at rest in her hip but movement causes it to hurt.  She has been unable to walk on it since last night.  No weakness or numbness in the extremity.  She took some Tylenol yesterday, none today.  Past Medical History:  Diagnosis Date  . Fatigue   . Fever   . Giant cell aortic arteritis 04/14/2013  . Hoarseness    sore throat  . HTN (hypertension) 04/14/2013  . Menopause   . Night sweats   . Stroke     Patient Active Problem List   Diagnosis Date Noted  . Headache 04/14/2013  . HTN (hypertension) 04/14/2013  . Giant cell aortic arteritis (HCC) 04/14/2013  . TIA (transient ischemic attack) 04/14/2013    Past Surgical History:  Procedure Laterality Date  . Bilateral cateract surgery    . JOINT REPLACEMENT  2003   right hip  . TEMPORAL ARTERY BIOPSY / LIGATION     07/2010     OB History   No obstetric history on file.      Home Medications    Prior to Admission medications   Medication Sig Start Date End Date Taking? Authorizing Provider  aspirin 81 MG tablet Take 81 mg by mouth daily.     Yes [provider]  benazepril-hydrochlorthiazide (LOTENSIN HCT) 10-12.5 MG per tablet Take 1 tablet by mouth daily.   Yes [provider]  docusate sodium (STOOL SOFTENER) 100 MG capsule Take 100 mg by mouth at bedtime as needed for mild constipation.   Yes [provider]  fish  oil-omega-3 fatty acids 1000 MG capsule Take 2 g by mouth daily.    Yes [provider]  Glucosamine HCl 1500 MG TABS Take 1,500 tablets by mouth at bedtime.    Yes [provider]  atorvastatin (LIPITOR) 10 MG tablet Take 1 tablet (10 mg total) by mouth daily. Patient not taking: Reported on 06/02/2018 04/15/13   Calvert Cantor, MD    Family History Family History  Problem Relation Age of Onset  . Stroke Mother   . Alcoholism Mother     Social History Social History   Tobacco Use  . Smoking status: Never Smoker  Substance Use Topics  . Alcohol use: No  . Drug use: No     Allergies   Tetanus toxoids   Review of Systems Review of Systems  Musculoskeletal: Positive for arthralgias.  Neurological: Negative for syncope, weakness, numbness and headaches.  All other systems reviewed and are negative.    Physical Exam Updated Vital Signs BP (!) 103/55 (BP Location: Left Arm)   Pulse 78   Temp 98.8 F (37.1 C) (Oral)   Resp 20   SpO2 95%   Physical Exam Vitals signs and nursing note reviewed.  Constitutional:  Appearance: She is well-developed.  HENT:     Head: Normocephalic and atraumatic.     Right Ear: External ear normal.     Left Ear: External ear normal.     Nose: Nose normal.  Eyes:     General:        Right eye: No discharge.        Left eye: No discharge.  Cardiovascular:     Rate and Rhythm: Normal rate and regular rhythm.     Pulses:          Dorsalis pedis pulses are 2+ on the right side and 2+ on the left side.  Pulmonary:     Effort: Pulmonary effort is normal.  Abdominal:     General: There is no distension.  Musculoskeletal:     Right hip: She exhibits decreased range of motion. She exhibits no tenderness.     Right knee: She exhibits no swelling. No tenderness found.     Right upper leg: She exhibits no tenderness.     Comments: Normal strength/sensation in R foot  Skin:    General: Skin is warm and dry.   Neurological:     Mental Status: She is alert.  Psychiatric:        Mood and Affect: Mood is not anxious.      ED Treatments / Results  Labs (all labs ordered are listed, but only abnormal results are displayed) Labs Reviewed - No data to display  EKG None  Radiology Ct Pelvis Wo Contrast  Result Date: 06/02/2018 CLINICAL DATA:  Right hip pain after fall. EXAM: CT PELVIS WITHOUT CONTRAST TECHNIQUE: Multidetector CT imaging of the pelvis was performed following the standard protocol without intravenous contrast. COMPARISON:  Plain films earlier today. FINDINGS: Urinary Tract:  No abnormality visualized. Bowel: Sigmoid diverticulosis. Visualized pelvic small bowel unremarkable. Appendix is normal. Vascular/Lymphatic: Aortoiliac atherosclerosis. No aneurysm or adenopathy. Reproductive:  Prior hysterectomy.  No adnexal masses. Other:  No free fluid or free air. Musculoskeletal: Patient is status post right hip replacement. Fractures are noted within the proximal right femur around the stem of the hip replacement. No subluxation or dislocation. No additional pelvic fracture. IMPRESSION: Changes of right hip replacement. Fractures noted in the proximal right femur around the femoral stem of the hip replacement. No subluxation or dislocation. Sigmoid diverticulosis.  No active diverticulitis. Electronically Signed   By: Charlett NoseKevin  Dover M.D.   On: 06/02/2018 18:52   Dg Hip Unilat W Or Wo Pelvis 2-3 Views Right  Result Date: 06/02/2018 CLINICAL DATA:  Right hip pain EXAM: DG HIP (WITH OR WITHOUT PELVIS) 2-3V RIGHT COMPARISON:  None. FINDINGS: Changes of right hip replacement. No hardware complicating feature. No acute bony abnormality. Specifically, no fracture, subluxation, or dislocation. IMPRESSION: Right hip replacement.  No acute bony abnormality. Electronically Signed   By: Charlett NoseKevin  Dover M.D.   On: 06/02/2018 16:34    Procedures Procedures (including critical care time)  Medications Ordered in  ED Medications  fentaNYL (SUBLIMAZE) injection 50 mcg (has no administration in time range)  HYDROcodone-acetaminophen (NORCO/VICODIN) 5-325 MG per tablet 1 tablet (1 tablet Oral Given 06/02/18 1704)     Initial Impression / Assessment and Plan / ED Course  I have reviewed the triage vital signs and the nursing notes.  Pertinent labs & imaging results that were available during my care of the patient were reviewed by me and considered in my medical decision making (see chart for details).  Patient's xray is benign. However she had significant difficulty with walking with a walker. Given this amount of pain, CT ordered and shows periprosthetic femur fracture. D/w Dr. Susa Simmonds, who has discussed with Dr. Turner Daniels, who has viewed the CT. patient does not need to come into the hospital for surgery.  She may need outpatient revision/replacement of her hip prosthesis but no emergent surgical intervention is needed.  Ortho asked for patient to be on "limited weightbearing", where she can weight-bear if needing to get up out of a wheelchair or pivot but otherwise should be in a wheelchair or nonweightbearing.  The patient states she has a wheelchair at home.  She also has a walker as needed for pivoting.  She was able to bear some weight here though it was painful.  She declines anything stronger than Tylenol for at home use.  She does not know any orthopedist in [place] where she originally had the surgery as that orthopedist has retired.  She has been referred to Dr. Lequita Halt at the suggestion of Dr. Susa Simmonds. I discussed with patient's daughter, Arline Asp, about the plan originally. Tried to call back after full plan established but could not get in contact.   Final Clinical Impressions(s) / ED Diagnoses   Final diagnoses:  Periprosthetic fracture of hip, initial encounter    ED Discharge Orders    None       Pricilla Loveless, MD 06/02/18 2211

## 2018-06-02 NOTE — ED Triage Notes (Signed)
She felt pain in her right hip area yesterday upon movement. She did not come to hospital then d/t a close relative was getting married today. The pain became significant today, so she decided to come to hospital. Her right leg appears shorter than the left. She is "comfortable if I don't move". She is alert and  Oriented x 4.

## 2018-07-23 ENCOUNTER — Ambulatory Visit (INDEPENDENT_AMBULATORY_CARE_PROVIDER_SITE_OTHER): Payer: Medicare Other | Admitting: Podiatry

## 2018-07-23 ENCOUNTER — Other Ambulatory Visit: Payer: Self-pay

## 2018-07-23 ENCOUNTER — Encounter: Payer: Self-pay | Admitting: Podiatry

## 2018-07-23 VITALS — BP 102/45 | HR 64 | Temp 98.8°F

## 2018-07-23 DIAGNOSIS — I739 Peripheral vascular disease, unspecified: Secondary | ICD-10-CM | POA: Diagnosis not present

## 2018-07-23 DIAGNOSIS — L84 Corns and callosities: Secondary | ICD-10-CM

## 2018-07-23 DIAGNOSIS — M2012 Hallux valgus (acquired), left foot: Secondary | ICD-10-CM | POA: Diagnosis not present

## 2018-07-23 DIAGNOSIS — B351 Tinea unguium: Secondary | ICD-10-CM | POA: Diagnosis not present

## 2018-07-23 DIAGNOSIS — M2011 Hallux valgus (acquired), right foot: Secondary | ICD-10-CM

## 2018-07-23 DIAGNOSIS — M79675 Pain in left toe(s): Secondary | ICD-10-CM

## 2018-07-23 DIAGNOSIS — M79674 Pain in right toe(s): Secondary | ICD-10-CM | POA: Diagnosis not present

## 2018-07-23 NOTE — Patient Instructions (Addendum)
Corns and Calluses Corns are small areas of thickened skin that occur on the top, sides, or tip of a toe. They contain a cone-shaped core with a point that can press on a nerve below. This causes pain.  Calluses are areas of thickened skin that can occur anywhere on the body, including the hands, fingers, palms, soles of the feet, and heels. Calluses are usually larger than corns. What are the causes? Corns and calluses are caused by rubbing (friction) or pressure, such as from shoes that are too tight or do not fit properly. What increases the risk? Corns are more likely to develop in people who have misshapen toes (toe deformities), such as hammer toes. Calluses can occur with friction to any area of the skin. They are more likely to develop in people who:  Work with their hands.  Wear shoes that fit poorly, are too tight, or are high-heeled.  Have toe deformities. What are the signs or symptoms? Symptoms of a corn or callus include:  A hard growth on the skin.  Pain or tenderness under the skin.  Redness and swelling.  Increased discomfort while wearing tight-fitting shoes, if your feet are affected. If a corn or callus becomes infected, symptoms may include:  Redness and swelling that gets worse.  Pain.  Fluid, blood, or pus draining from the corn or callus. How is this diagnosed? Corns and calluses may be diagnosed based on your symptoms, your medical history, and a physical exam. How is this treated? Treatment for corns and calluses may include:  Removing the cause of the friction or pressure. This may involve: ? Changing your shoes. ? Wearing shoe inserts (orthotics) or other protective layers in your shoes, such as a corn pad. ? Wearing gloves.  Applying medicine to the skin (topical medicine) to help soften skin in the hardened, thickened areas.  Removing layers of dead skin with a file to reduce the size of the corn or callus.  Removing the corn or callus with a  scalpel or laser.  Taking antibiotic medicines, if your corn or callus is infected.  Having surgery, if a toe deformity is the cause. Follow these instructions at home:   Take over-the-counter and prescription medicines only as told by your health care provider.  If you were prescribed an antibiotic, take it as told by your health care provider. Do not stop taking it even if your condition starts to improve.  Wear shoes that fit well. Avoid wearing high-heeled shoes and shoes that are too tight or too loose.  Wear any padding, protective layers, gloves, or orthotics as told by your health care provider.  Soak your hands or feet and then use a file or pumice stone to soften your corn or callus. Do this as told by your health care provider.  Check your corn or callus every day for symptoms of infection. Contact a health care provider if you:  Notice that your symptoms do not improve with treatment.  Have redness or swelling that gets worse.  Notice that your corn or callus becomes painful.  Have fluid, blood, or pus coming from your corn or callus.  Have new symptoms. Summary  Corns are small areas of thickened skin that occur on the top, sides, or tip of a toe.  Calluses are areas of thickened skin that can occur anywhere on the body, including the hands, fingers, palms, and soles of the feet. Calluses are usually larger than corns.  Corns and calluses are caused by   rubbing (friction) or pressure, such as from shoes that are too tight or do not fit properly.  Treatment may include wearing any padding, protective layers, gloves, or orthotics as told by your health care provider. This information is not intended to replace advice given to you by your health care provider. Make sure you discuss any questions you have with your health care provider. Document Released: 09/26/2003 Document Revised: 04/11/2018 Document Reviewed: 11/02/2016 Elsevier Patient Education  2020 [employer]   Onychomycosis/Fungal Toenails  WHAT IS IT? An infection that lies within the keratin of your nail plate that is caused by a fungus.  WHY ME? Fungal infections affect all ages, sexes, races, and creeds.  There may be many factors that predispose you to a fungal infection such as age, coexisting medical conditions such as diabetes, or an autoimmune disease; stress, medications, fatigue, genetics, etc.  Bottom line: fungus thrives in a warm, moist environment and your shoes offer such a location.  IS IT CONTAGIOUS? Theoretically, yes.  You do not want to share shoes, nail clippers or files with someone who has fungal toenails.  Walking around barefoot in the same room or sleeping in the same bed is unlikely to transfer the organism.  It is important to realize, however, that fungus can spread easily from one nail to the next on the same foot.  HOW DO WE TREAT THIS?  There are several ways to treat this condition.  Treatment may depend on many factors such as age, medications, pregnancy, liver and kidney conditions, etc.  It is best to ask your doctor which options are available to you.  1. No treatment.   Unlike many other medical concerns, you can live with this condition.  However for many people this can be a painful condition and may lead to ingrown toenails or a bacterial infection.  It is recommended that you keep the nails cut short to help reduce the amount of fungal nail. 2. Topical treatment.  These range from herbal remedies to prescription strength nail lacquers.  About 40-50% effective, topicals require twice daily application for approximately 9 to 12 months or until an entirely new nail has grown out.  The most effective topicals are medical grade medications available through physicians offices. 3. Oral antifungal medications.  With an 80-90% cure rate, the most common oral medication requires 3 to 4 months of therapy and stays in your system for a year as the new nail grows out.   Oral antifungal medications do require blood work to make sure it is a safe drug for you.  A liver function panel will be performed prior to starting the medication and after the first month of treatment.  It is important to have the blood work performed to avoid any harmful side effects.  In general, this medication safe but blood work is required. 4. Laser Therapy.  This treatment is performed by applying a specialized laser to the affected nail plate.  This therapy is noninvasive, fast, and non-painful.  It is not covered by insurance and is therefore, out of pocket.  The results have been very good with a 80-95% cure rate.  [facility] is the only practice in the area to offer this therapy. 5. Permanent Nail Avulsion.  Removing the entire nail so that a new nail will not grow back. 

## 2018-07-26 ENCOUNTER — Encounter: Payer: Self-pay | Admitting: Podiatry

## 2018-07-26 NOTE — Progress Notes (Signed)
Subjective: Madison Bennett presents today referred by Maurice SmallGriffin, Elaine, MD with cc of painful, discolored, thick toenails and painful plantar calluses which interfere with daily activities.  Pain is aggravated when wearing enclosed shoe gear and/or when weightbearing.     Past Medical History:  Diagnosis Date  . Fatigue   . Fever   . Giant cell aortic arteritis (HCC) 04/14/2013  . Hoarseness    sore throat  . HTN (hypertension) 04/14/2013  . Menopause   . Night sweats   . Stroke [facility](HCC)      Patient Active Problem List   Diagnosis Date Noted  . Headache 04/14/2013  . HTN (hypertension) 04/14/2013  . Giant cell aortic arteritis (HCC) 04/14/2013  . TIA (transient ischemic attack) 04/14/2013     Past Surgical History:  Procedure Laterality Date  . Bilateral cateract surgery    . JOINT REPLACEMENT  2003   right hip  . TEMPORAL ARTERY BIOPSY / LIGATION     07/2010      Current Outpatient Medications:  .  aspirin 81 MG tablet, Take 81 mg by mouth daily.  , Disp: , Rfl:  .  atorvastatin (LIPITOR) 10 MG tablet, Take 1 tablet (10 mg total) by mouth daily. (Patient not taking: Reported on 06/02/2018), Disp: 30 tablet, Rfl: 0 .  benazepril (LOTENSIN) 10 MG tablet, benazepril 10 mg tablet, Disp: , Rfl:  .  benazepril-hydrochlorthiazide (LOTENSIN HCT) 10-12.5 MG per tablet, Take 1 tablet by mouth daily., Disp: , Rfl:  .  ciprofloxacin (CIPRO) 250 MG tablet, , Disp: , Rfl:  .  docusate sodium (STOOL SOFTENER) 100 MG capsule, Take 100 mg by mouth at bedtime as needed for mild constipation., Disp: , Rfl:  .  fish oil-omega-3 fatty acids 1000 MG capsule, Take 2 g by mouth daily. , Disp: , Rfl:  .  Glucosamine HCl 1500 MG TABS, Take 1,500 tablets by mouth at bedtime. , Disp: , Rfl:  .  Sennosides (SENNA-TIME PO), senna, Disp: , Rfl:  .  sertraline (ZOLOFT) 50 MG tablet, , Disp: , Rfl:    Allergies  Allergen Reactions  . Tetanus Toxoids Swelling     Social History   Occupational History   . Not on file  Tobacco Use  . Smoking status: Never Smoker  Substance and Sexual Activity  . Alcohol use: No  . Drug use: No  . Sexual activity: Not on file     Family History  Problem Relation Age of Onset  . Stroke Mother   . Alcoholism Mother       There is no immunization history on file for this patient.   Review of systems: Positive Findings in bold print.  Constitutional:  chills, fatigue, fever, sweats, weight change Communication: Nurse, learning disabilitytranslator, sign Presenter, broadcastinglanguage translator, hand writing, iPad/Android device Head: headaches, head injury Eyes: changes in vision, eye pain, glaucoma, cataracts, macular degeneration, diplopia, glare,  light sensitivity, eyeglasses or contacts, blindness Ears nose mouth throat: hearing impaired, hearing aids,  ringing in ears, deaf, sign language,  vertigo,   nosebleeds,  rhinitis,  cold sores, snoring, swollen glands Cardiovascular: HTN, edema, arrhythmia, pacemaker in place, defibrillator in place, chest pain/tightness, chronic anticoagulation, blood clot, heart failure, MI Peripheral Vascular: leg cramps, varicose veins, blood clots, lymphedema, varicosities Respiratory:  difficulty breathing, denies congestion, SOB, wheezing, cough, emphysema Gastrointestinal: change in appetite or weight, abdominal pain, constipation, diarrhea, nausea, vomiting, vomiting blood, change in bowel habits, abdominal pain, jaundice, rectal bleeding, hemorrhoids, GERD Genitourinary:  nocturia,  pain on urination,  polyuria,  blood in urine, Foley catheter, urinary urgency, ESRD on hemodialysis Musculoskeletal: amputation, cramping, stiff joints, painful joints, decreased joint motion, fractures, OA, gout, hemiplegia, paraplegia, uses cane, wheelchair bound, uses walker, uses rollator Skin: +changes in toenails, color change, dryness, itching, mole changes,  rash, wound(s) Neurological: headaches, numbness in feet, paresthesias in feet, burning in feet, fainting,   seizures, change in speech. denies headaches, memory problems/poor historian, cerebral palsy, weakness, paralysis, CVA, TIA Endocrine: diabetes, hypothyroidism, hyperthyroidism,  goiter, dry mouth, flushing, heat intolerance,  cold intolerance,  excessive thirst, denies polyuria,  nocturia Hematological:  easy bleeding, excessive bleeding, easy bruising, enlarged lymph nodes, on long term blood thinner, history of past transusions Allergy/immunological:  hives, eczema, frequent infections, multiple drug allergies, seasonal allergies, transplant recipient, multiple food allergies Psychiatric:  anxiety, depression, mood disorder, suicidal ideations, hallucinations, insomnia  Objective: Vitals:   07/23/18 1113  BP: (!) 102/45  Pulse: 64  Temp: 98.8 F (37.1 C)    Vascular Examination: Capillary refill time immediate x 10 digits.  Dorsalis pedis pulses faintly palpable b/l.   Posterior tibial pulses nonpalpable b/l.   No digital hair x 10 digits.  Skin temperature gradient WNL b/l  Dermatological Examination: Skin with normal turgor, texture and tone b/l.  Toenails 1-5 b/l discolored, thick, dystrophic with subungual debris and pain with palpation to nailbeds due to thickness of nails.  Hyperkeratotic lesion submet head 3 right foot and dorsal PIPJ right foot with tenderness to palpation. No edema, no erythema, no drainage, no flocculence.  Old subungual hematoma left hallux. Stable and adhered. No erythema, no edema, no drainage.  Musculoskeletal: Muscle strength 5/5 to all LE muscle groups.  HAV with bunion b/l.  Hammertoe 2-5 b/l.  Neurological: Sensation intact with 10 gram monofilament.  Vibratory sensation intact.  Assessment: 1. Painful onychomycosis toenails 1-5 b/l  2.  Callus submet head 5 right foot 3.  Corn right 5th digit 4.  PAD  Plan: 1. Discussed onychomycosis and treatment options.  Literature dispensed on today. 2. Calluses pared submetatarsal  head(s) 5 right foot utilizing sterile scalpel blade without incident.Corn(s) pared right 5th digit utilizing sterile scalpel blade without incident. 3. Toenails 1-5 b/l were debrided in length and girth without iatrogenic bleeding. 4. Patient to continue soft, supportive shoe gear daily. 5. Patient to report any pedal injuries to medical professional immediately. 6. Follow up 3 months.  7. Patient/POA to call should there be a concern in the interim.

## 2018-10-22 ENCOUNTER — Ambulatory Visit: Payer: Medicare Other | Admitting: Podiatry
